# Patient Record
Sex: Male | Born: 1935 | Race: Black or African American | Hispanic: No | Marital: Married | Smoking: Former smoker
Health system: Southern US, Community
[De-identification: ages and names within clinical notes are randomized; demographics above are authoritative.]

## PROBLEM LIST (undated history)

## (undated) DIAGNOSIS — I779 Disorder of arteries and arterioles, unspecified: Secondary | ICD-10-CM

## (undated) DIAGNOSIS — J9811 Atelectasis: Secondary | ICD-10-CM

## (undated) DIAGNOSIS — M1990 Unspecified osteoarthritis, unspecified site: Secondary | ICD-10-CM

## (undated) DIAGNOSIS — I2510 Atherosclerotic heart disease of native coronary artery without angina pectoris: Secondary | ICD-10-CM

## (undated) DIAGNOSIS — R1310 Dysphagia, unspecified: Secondary | ICD-10-CM

## (undated) DIAGNOSIS — R269 Unspecified abnormalities of gait and mobility: Secondary | ICD-10-CM

## (undated) DIAGNOSIS — I739 Peripheral vascular disease, unspecified: Secondary | ICD-10-CM

## (undated) DIAGNOSIS — I4892 Unspecified atrial flutter: Secondary | ICD-10-CM

## (undated) DIAGNOSIS — R0600 Dyspnea, unspecified: Secondary | ICD-10-CM

## (undated) DIAGNOSIS — I509 Heart failure, unspecified: Secondary | ICD-10-CM

## (undated) DIAGNOSIS — I249 Acute ischemic heart disease, unspecified: Secondary | ICD-10-CM

## (undated) DIAGNOSIS — J189 Pneumonia, unspecified organism: Secondary | ICD-10-CM

## (undated) DIAGNOSIS — I480 Paroxysmal atrial fibrillation: Secondary | ICD-10-CM

## (undated) DIAGNOSIS — E785 Hyperlipidemia, unspecified: Secondary | ICD-10-CM

## (undated) DIAGNOSIS — I10 Essential (primary) hypertension: Secondary | ICD-10-CM

## (undated) DIAGNOSIS — K5900 Constipation, unspecified: Secondary | ICD-10-CM

## (undated) DIAGNOSIS — H5440 Blindness, one eye, unspecified eye: Secondary | ICD-10-CM

## (undated) DIAGNOSIS — F0390 Unspecified dementia without behavioral disturbance: Secondary | ICD-10-CM

## (undated) DIAGNOSIS — N400 Enlarged prostate without lower urinary tract symptoms: Secondary | ICD-10-CM

## (undated) DIAGNOSIS — F17201 Nicotine dependence, unspecified, in remission: Secondary | ICD-10-CM

## (undated) HISTORY — PX: SHOULDER HEMI-ARTHROPLASTY: SHX5049

## (undated) HISTORY — DX: Paroxysmal atrial fibrillation: I48.0

## (undated) HISTORY — DX: Nicotine dependence, unspecified, in remission: F17.201

## (undated) HISTORY — DX: Unspecified osteoarthritis, unspecified site: M19.90

## (undated) HISTORY — PX: CATARACT EXTRACTION W/ INTRAOCULAR LENS IMPLANT: SHX1309

## (undated) HISTORY — DX: Benign prostatic hyperplasia without lower urinary tract symptoms: N40.0

## (undated) HISTORY — DX: Hyperlipidemia, unspecified: E78.5

---

## 2001-03-29 HISTORY — PX: CORONARY ARTERY BYPASS GRAFT: SHX141

## 2001-04-17 ENCOUNTER — Encounter: Payer: Self-pay | Admitting: Cardiology

## 2001-04-17 ENCOUNTER — Ambulatory Visit (HOSPITAL_COMMUNITY): Admission: RE | Admit: 2001-04-17 | Discharge: 2001-04-17 | Payer: Self-pay | Admitting: Cardiology

## 2001-04-19 ENCOUNTER — Inpatient Hospital Stay (HOSPITAL_COMMUNITY): Admission: RE | Admit: 2001-04-19 | Discharge: 2001-04-28 | Payer: Self-pay | Admitting: Cardiology

## 2001-04-20 ENCOUNTER — Encounter: Payer: Self-pay | Admitting: Thoracic Surgery (Cardiothoracic Vascular Surgery)

## 2001-04-21 ENCOUNTER — Encounter: Payer: Self-pay | Admitting: Thoracic Surgery (Cardiothoracic Vascular Surgery)

## 2001-04-22 ENCOUNTER — Encounter: Payer: Self-pay | Admitting: Thoracic Surgery (Cardiothoracic Vascular Surgery)

## 2001-04-23 ENCOUNTER — Encounter: Payer: Self-pay | Admitting: Surgery

## 2001-04-24 ENCOUNTER — Encounter: Payer: Self-pay | Admitting: Thoracic Surgery (Cardiothoracic Vascular Surgery)

## 2001-04-24 ENCOUNTER — Encounter: Payer: Self-pay | Admitting: Surgery

## 2001-04-25 ENCOUNTER — Encounter: Payer: Self-pay | Admitting: Thoracic Surgery (Cardiothoracic Vascular Surgery)

## 2001-04-26 ENCOUNTER — Encounter: Payer: Self-pay | Admitting: Thoracic Surgery (Cardiothoracic Vascular Surgery)

## 2001-04-27 ENCOUNTER — Encounter: Payer: Self-pay | Admitting: Thoracic Surgery (Cardiothoracic Vascular Surgery)

## 2001-04-28 ENCOUNTER — Encounter: Payer: Self-pay | Admitting: Thoracic Surgery (Cardiothoracic Vascular Surgery)

## 2001-05-10 ENCOUNTER — Encounter: Payer: Self-pay | Admitting: Cardiology

## 2001-05-10 ENCOUNTER — Ambulatory Visit (HOSPITAL_COMMUNITY): Admission: RE | Admit: 2001-05-10 | Discharge: 2001-05-10 | Payer: Self-pay | Admitting: Cardiology

## 2001-05-22 ENCOUNTER — Encounter
Admission: RE | Admit: 2001-05-22 | Discharge: 2001-05-22 | Payer: Self-pay | Admitting: Thoracic Surgery (Cardiothoracic Vascular Surgery)

## 2001-05-22 ENCOUNTER — Encounter: Payer: Self-pay | Admitting: Thoracic Surgery (Cardiothoracic Vascular Surgery)

## 2001-05-23 ENCOUNTER — Encounter (HOSPITAL_COMMUNITY): Admission: RE | Admit: 2001-05-23 | Discharge: 2001-06-22 | Payer: Self-pay | Admitting: Cardiology

## 2001-06-21 ENCOUNTER — Encounter (HOSPITAL_COMMUNITY): Admission: RE | Admit: 2001-06-21 | Discharge: 2001-07-21 | Payer: Self-pay | Admitting: Cardiology

## 2001-07-24 ENCOUNTER — Encounter (HOSPITAL_COMMUNITY): Admission: RE | Admit: 2001-07-24 | Discharge: 2001-08-23 | Payer: Self-pay | Admitting: Cardiology

## 2001-08-10 ENCOUNTER — Ambulatory Visit (HOSPITAL_COMMUNITY): Admission: RE | Admit: 2001-08-10 | Discharge: 2001-08-10 | Payer: Self-pay | Admitting: Cardiology

## 2001-08-10 ENCOUNTER — Encounter: Payer: Self-pay | Admitting: Cardiology

## 2001-08-25 ENCOUNTER — Encounter (HOSPITAL_COMMUNITY): Admission: RE | Admit: 2001-08-25 | Discharge: 2001-09-24 | Payer: Self-pay | Admitting: Cardiology

## 2001-10-05 ENCOUNTER — Ambulatory Visit (HOSPITAL_COMMUNITY): Admission: RE | Admit: 2001-10-05 | Discharge: 2001-10-05 | Payer: Self-pay | Admitting: Cardiology

## 2001-10-05 ENCOUNTER — Encounter: Payer: Self-pay | Admitting: Cardiology

## 2002-04-19 ENCOUNTER — Encounter (HOSPITAL_COMMUNITY): Admission: RE | Admit: 2002-04-19 | Discharge: 2002-05-19 | Payer: Self-pay | Admitting: Cardiology

## 2004-05-04 ENCOUNTER — Ambulatory Visit: Payer: Self-pay | Admitting: Cardiology

## 2004-05-19 ENCOUNTER — Ambulatory Visit: Payer: Self-pay | Admitting: Cardiology

## 2004-05-19 ENCOUNTER — Inpatient Hospital Stay (HOSPITAL_COMMUNITY): Admission: EM | Admit: 2004-05-19 | Discharge: 2004-05-20 | Payer: Self-pay | Admitting: Emergency Medicine

## 2004-05-20 ENCOUNTER — Encounter: Payer: Self-pay | Admitting: Cardiology

## 2004-05-25 ENCOUNTER — Ambulatory Visit: Payer: Self-pay | Admitting: *Deleted

## 2004-05-25 ENCOUNTER — Ambulatory Visit (HOSPITAL_COMMUNITY): Admission: RE | Admit: 2004-05-25 | Discharge: 2004-05-25 | Payer: Self-pay | Admitting: Cardiology

## 2004-06-02 ENCOUNTER — Ambulatory Visit: Payer: Self-pay | Admitting: Cardiology

## 2004-07-02 ENCOUNTER — Ambulatory Visit: Payer: Self-pay | Admitting: Cardiology

## 2004-09-24 ENCOUNTER — Ambulatory Visit: Payer: Self-pay | Admitting: Orthopedic Surgery

## 2004-10-29 ENCOUNTER — Ambulatory Visit: Payer: Self-pay | Admitting: Orthopedic Surgery

## 2004-11-10 ENCOUNTER — Ambulatory Visit: Payer: Self-pay | Admitting: Orthopedic Surgery

## 2004-11-10 ENCOUNTER — Inpatient Hospital Stay (HOSPITAL_COMMUNITY): Admission: RE | Admit: 2004-11-10 | Discharge: 2004-11-11 | Payer: Self-pay | Admitting: Orthopedic Surgery

## 2004-11-10 ENCOUNTER — Encounter: Payer: Self-pay | Admitting: Orthopedic Surgery

## 2004-11-16 ENCOUNTER — Encounter (HOSPITAL_COMMUNITY): Admission: RE | Admit: 2004-11-16 | Discharge: 2004-12-25 | Payer: Self-pay | Admitting: Orthopedic Surgery

## 2004-11-18 ENCOUNTER — Ambulatory Visit: Payer: Self-pay | Admitting: Orthopedic Surgery

## 2004-12-16 ENCOUNTER — Ambulatory Visit: Payer: Self-pay | Admitting: Orthopedic Surgery

## 2004-12-28 ENCOUNTER — Encounter (HOSPITAL_COMMUNITY): Admission: RE | Admit: 2004-12-28 | Discharge: 2005-01-27 | Payer: Self-pay | Admitting: Orthopedic Surgery

## 2005-01-27 DIAGNOSIS — I679 Cerebrovascular disease, unspecified: Secondary | ICD-10-CM | POA: Insufficient documentation

## 2005-01-29 ENCOUNTER — Encounter (HOSPITAL_COMMUNITY): Admission: RE | Admit: 2005-01-29 | Discharge: 2005-02-28 | Payer: Self-pay | Admitting: Orthopedic Surgery

## 2005-02-05 ENCOUNTER — Ambulatory Visit: Payer: Self-pay | Admitting: Cardiology

## 2005-02-09 ENCOUNTER — Ambulatory Visit (HOSPITAL_COMMUNITY): Admission: RE | Admit: 2005-02-09 | Discharge: 2005-02-09 | Payer: Self-pay | Admitting: Cardiology

## 2005-02-15 ENCOUNTER — Ambulatory Visit: Payer: Self-pay | Admitting: Orthopedic Surgery

## 2005-03-01 ENCOUNTER — Encounter (HOSPITAL_COMMUNITY): Admission: RE | Admit: 2005-03-01 | Discharge: 2005-03-12 | Payer: Self-pay | Admitting: Orthopedic Surgery

## 2005-04-01 ENCOUNTER — Ambulatory Visit: Payer: Self-pay | Admitting: Cardiology

## 2005-05-13 ENCOUNTER — Ambulatory Visit: Payer: Self-pay | Admitting: Orthopedic Surgery

## 2005-06-23 ENCOUNTER — Ambulatory Visit (HOSPITAL_COMMUNITY): Admission: RE | Admit: 2005-06-23 | Discharge: 2005-06-23 | Payer: Self-pay | Admitting: Family Medicine

## 2005-08-19 ENCOUNTER — Ambulatory Visit: Payer: Self-pay | Admitting: *Deleted

## 2005-11-08 ENCOUNTER — Ambulatory Visit: Payer: Self-pay | Admitting: Orthopedic Surgery

## 2006-02-18 ENCOUNTER — Ambulatory Visit: Payer: Self-pay | Admitting: Cardiology

## 2006-02-26 IMAGING — US US CAROTID DUPLEX BILAT
1 series · 14 of 24 positions shown · non-contrast
Comparison: 04/17/01 by report.

CLINICAL DATA: Left carotid bruit.
BILATERAL CAROTID DUPLEX ULTRASOUND:

[Series 1: unknown · 0.07mm/px · 14 of 56 slices shown]
[im 1/56]
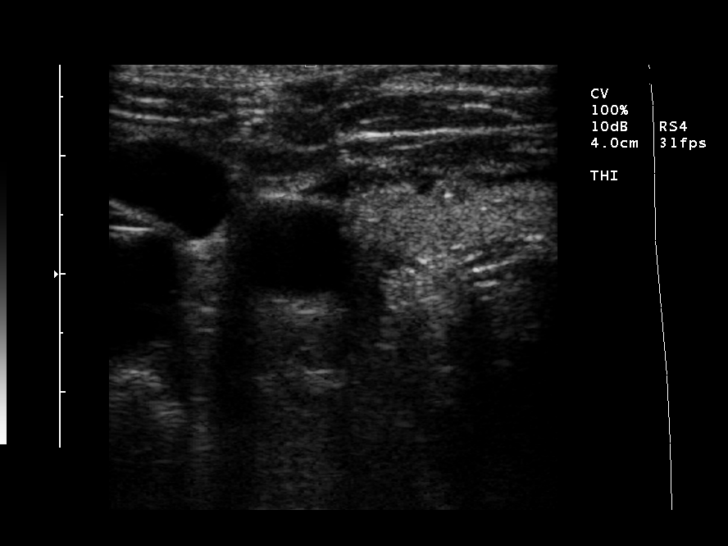
[im 5/56]
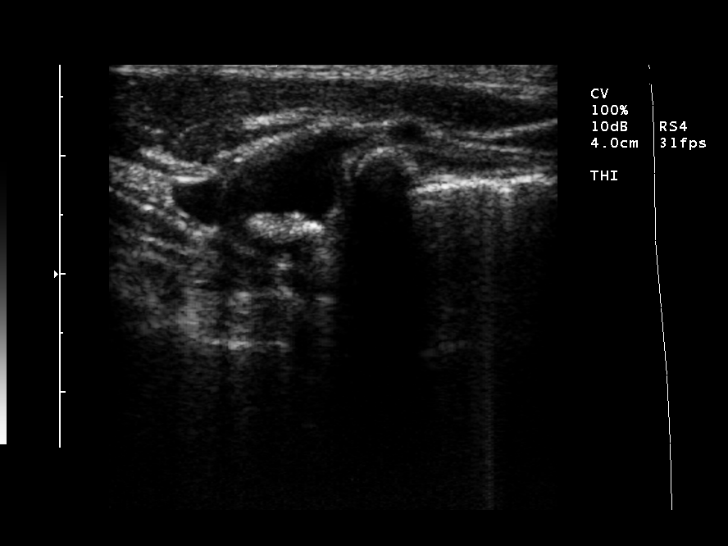
[im 10/56]
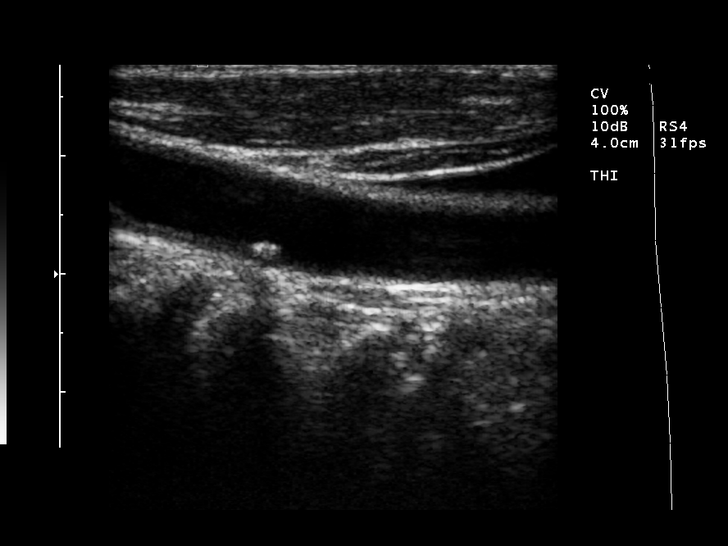
[im 15/56]
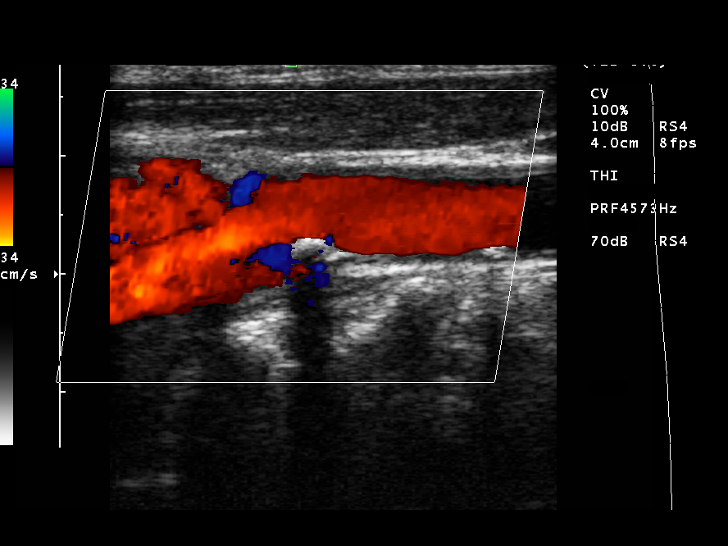
[im 17/56]
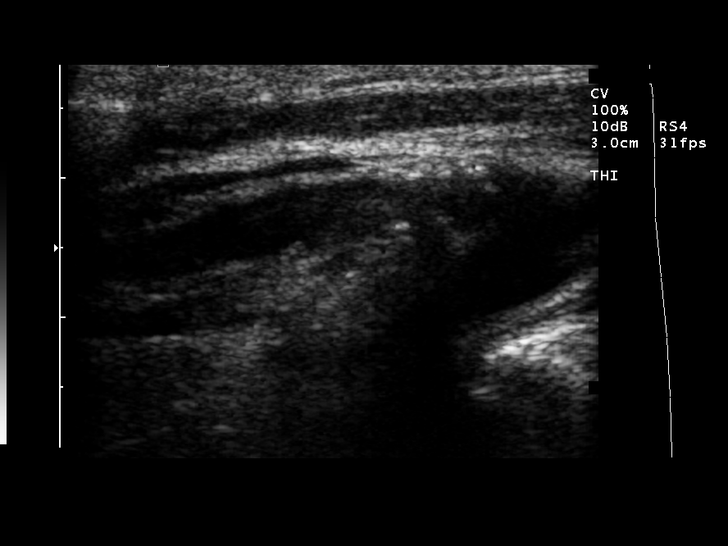
[im 22/56]
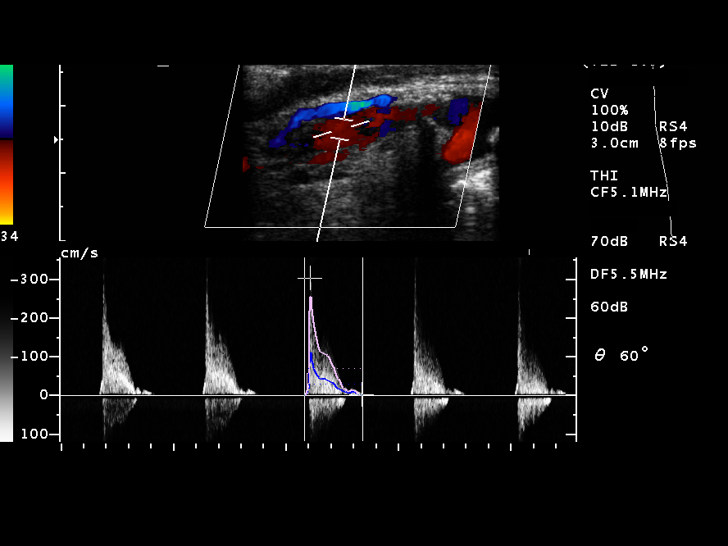
[im 27/56]
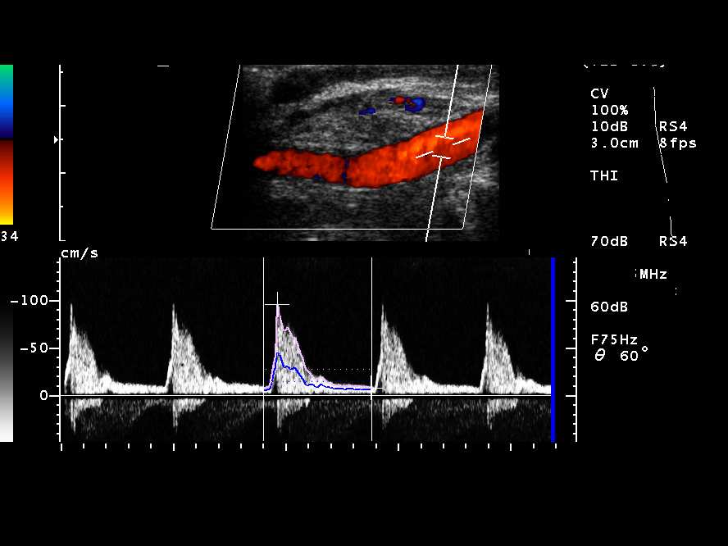
[im 29/56]
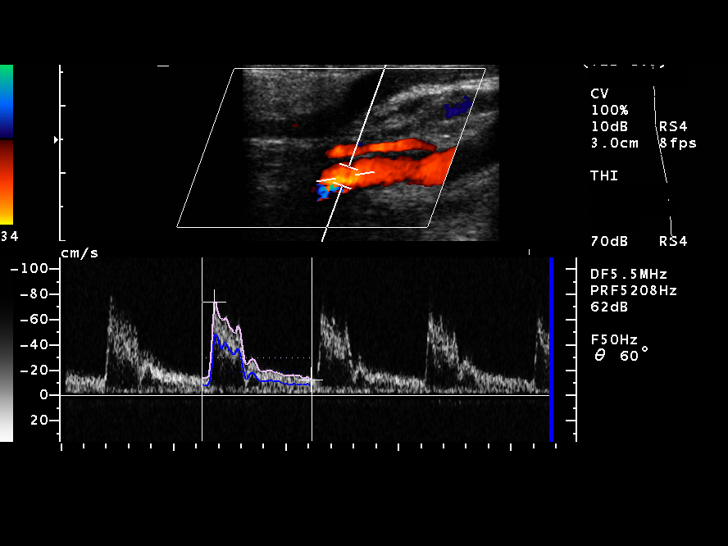
[im 34/56]
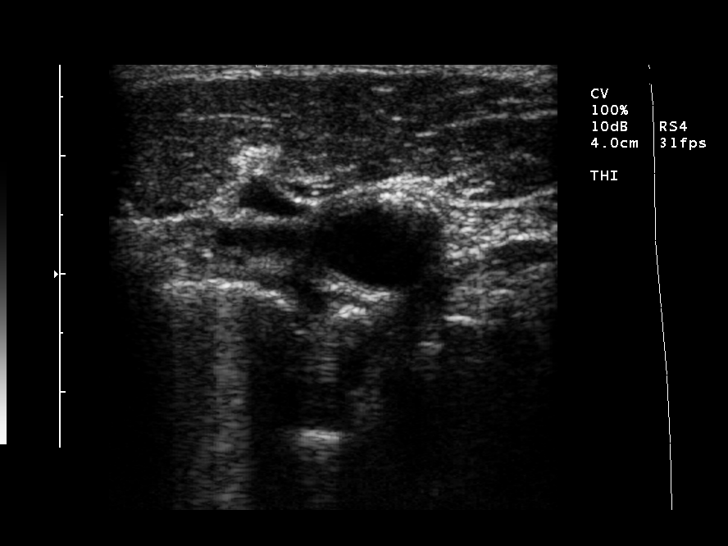
[im 39/56]
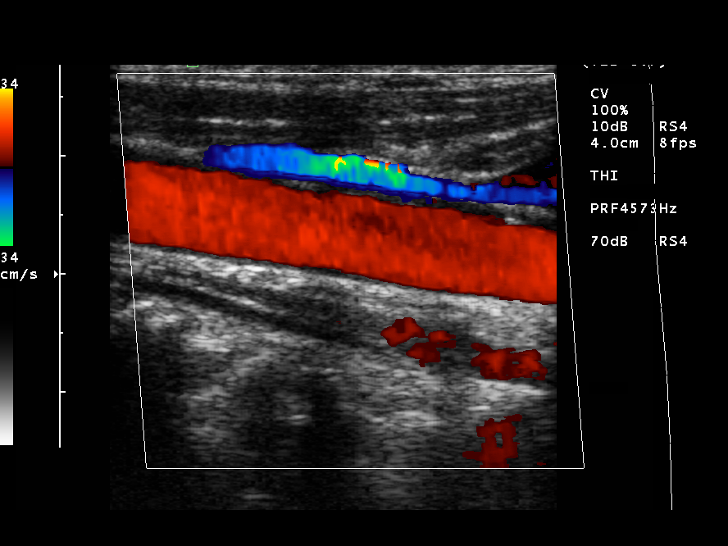
[im 44/56]
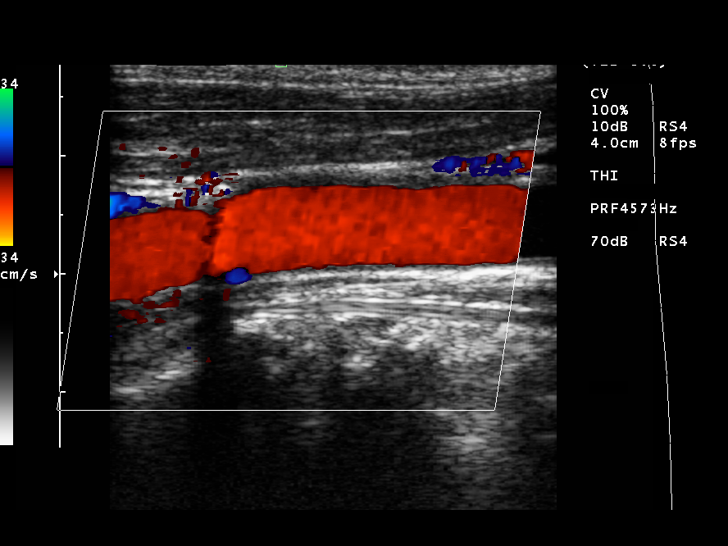
[im 46/56]
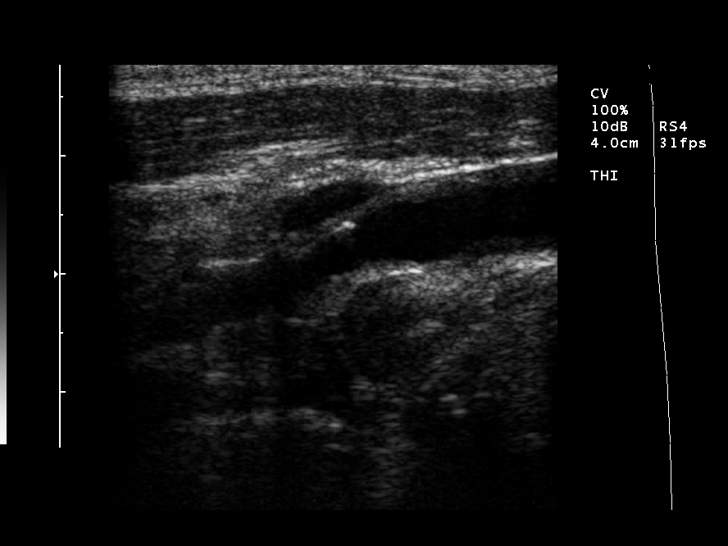
[im 51/56]
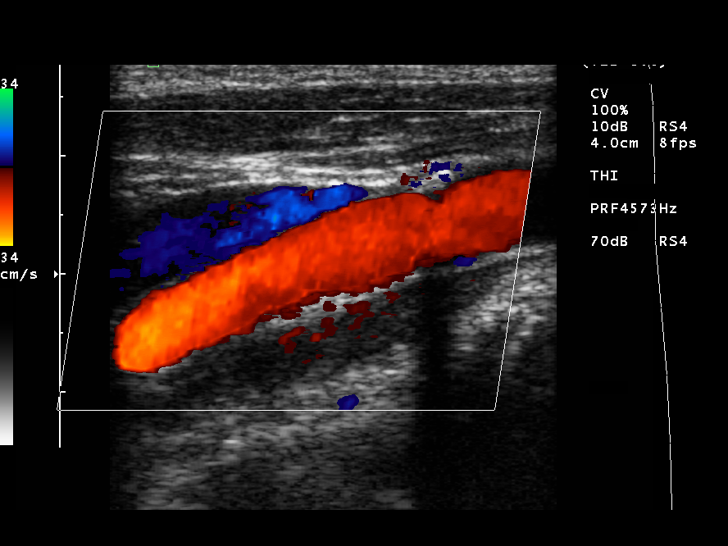
[im 56/56]
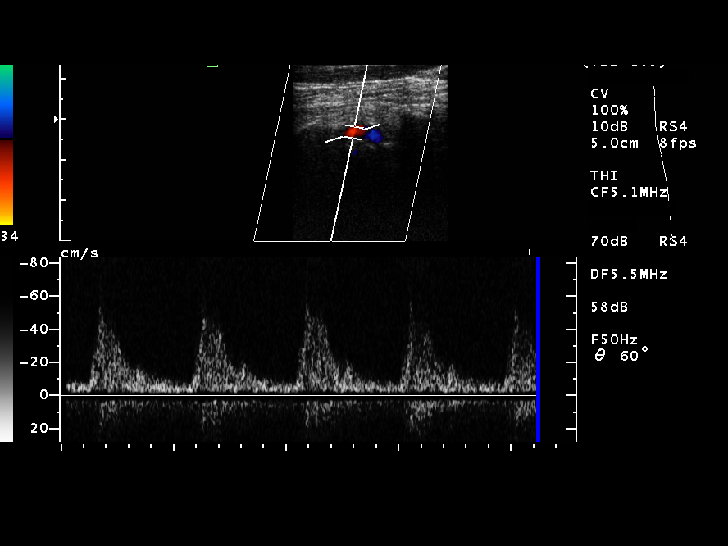

[14 of 24 positions shown; findings below may reference images not displayed]

FINDINGS: The exam is technically adequate.  There is partially calcified plaque in the distal right common carotid artery and bulb extending to the origins of the ICA and ECA.  There are elevated peak systolic velocities in the right external carotid artery origin.  Focal aliasing is seen here on color Doppler interrogation.  On the left, there is mild plaque in the carotid bulb extending into the proximal ICA.  Normal waveforms without elevated velocities or  aliasing on color Doppler interrogation.  Antegrade flow noted in both vertebral arteries. 
Velocities are as follows (cm/per second):  
  Right  Left
ICA
ECA
CCA
ICA/CCA RATIO
IMPRESSION: 1.  Bilateral carotid bifurcation and proximal ICA plaque without hemodynamically significant stenosis.  Continued surveillance recommended. 
2.  Origin plaque involving the external carotid arteries bilaterally, right greater than left, which may account for bruit on exam.

## 2006-09-04 ENCOUNTER — Emergency Department (HOSPITAL_COMMUNITY): Admission: EM | Admit: 2006-09-04 | Discharge: 2006-09-04 | Payer: Self-pay | Admitting: Emergency Medicine

## 2006-09-06 ENCOUNTER — Ambulatory Visit: Payer: Self-pay | Admitting: Cardiology

## 2006-09-07 ENCOUNTER — Ambulatory Visit: Payer: Self-pay | Admitting: Cardiology

## 2006-09-08 ENCOUNTER — Ambulatory Visit: Payer: Self-pay | Admitting: Cardiology

## 2006-09-09 ENCOUNTER — Ambulatory Visit (HOSPITAL_COMMUNITY): Admission: RE | Admit: 2006-09-09 | Discharge: 2006-09-09 | Payer: Self-pay | Admitting: Cardiology

## 2006-10-06 ENCOUNTER — Ambulatory Visit: Payer: Self-pay | Admitting: Cardiology

## 2006-11-09 ENCOUNTER — Ambulatory Visit: Payer: Self-pay | Admitting: Orthopedic Surgery

## 2007-04-19 ENCOUNTER — Ambulatory Visit: Payer: Self-pay | Admitting: Cardiology

## 2007-10-24 ENCOUNTER — Ambulatory Visit: Payer: Self-pay | Admitting: Cardiology

## 2007-12-07 ENCOUNTER — Ambulatory Visit: Payer: Self-pay | Admitting: Orthopedic Surgery

## 2008-04-18 ENCOUNTER — Ambulatory Visit: Payer: Self-pay | Admitting: Cardiology

## 2008-05-08 ENCOUNTER — Ambulatory Visit: Payer: Self-pay | Admitting: Ophthalmology

## 2008-05-20 ENCOUNTER — Ambulatory Visit: Payer: Self-pay | Admitting: Ophthalmology

## 2008-06-17 ENCOUNTER — Ambulatory Visit: Payer: Self-pay | Admitting: Cardiology

## 2008-11-13 ENCOUNTER — Encounter (INDEPENDENT_AMBULATORY_CARE_PROVIDER_SITE_OTHER): Payer: Self-pay | Admitting: *Deleted

## 2008-11-13 LAB — CONVERTED CEMR LAB
ALT: 21 units/L
AST: 27 units/L
Albumin: 4 g/dL
Alkaline Phosphatase: 82 units/L
BUN: 30 mg/dL
CO2: 27 meq/L
Calcium: 9.5 mg/dL
Chloride: 104 meq/L
Creatinine, Ser: 1.43 mg/dL
Glucose, Bld: 84 mg/dL
Potassium: 4.2 meq/L
Sodium: 140 meq/L
Total Protein: 7 g/dL

## 2008-11-27 ENCOUNTER — Ambulatory Visit (HOSPITAL_COMMUNITY): Admission: RE | Admit: 2008-11-27 | Discharge: 2008-11-27 | Payer: Self-pay | Admitting: Family Medicine

## 2008-12-30 ENCOUNTER — Ambulatory Visit: Payer: Self-pay | Admitting: Orthopedic Surgery

## 2009-04-09 ENCOUNTER — Encounter (INDEPENDENT_AMBULATORY_CARE_PROVIDER_SITE_OTHER): Payer: Self-pay | Admitting: *Deleted

## 2009-04-11 ENCOUNTER — Encounter (INDEPENDENT_AMBULATORY_CARE_PROVIDER_SITE_OTHER): Payer: Self-pay | Admitting: *Deleted

## 2009-04-11 ENCOUNTER — Ambulatory Visit: Payer: Self-pay | Admitting: Cardiology

## 2009-04-11 DIAGNOSIS — Z87898 Personal history of other specified conditions: Secondary | ICD-10-CM

## 2009-04-14 ENCOUNTER — Encounter: Payer: Self-pay | Admitting: Cardiology

## 2009-04-14 LAB — CONVERTED CEMR LAB
Cholesterol: 135 mg/dL (ref 0–200)
HDL: 41 mg/dL (ref >39)
LDL Cholesterol: 81 mg/dL (ref 0–99)
Total CHOL/HDL Ratio: 3.3
Triglycerides: 63 mg/dL (ref <150)
VLDL: 13 mg/dL (ref 0–40)

## 2009-04-16 ENCOUNTER — Encounter (INDEPENDENT_AMBULATORY_CARE_PROVIDER_SITE_OTHER): Payer: Self-pay | Admitting: *Deleted

## 2009-04-18 ENCOUNTER — Ambulatory Visit (HOSPITAL_COMMUNITY): Admission: RE | Admit: 2009-04-18 | Discharge: 2009-04-18 | Payer: Self-pay | Admitting: Cardiology

## 2009-12-26 ENCOUNTER — Ambulatory Visit: Payer: Self-pay | Admitting: Cardiology

## 2009-12-26 ENCOUNTER — Encounter: Payer: Self-pay | Admitting: Adult Health

## 2010-01-02 ENCOUNTER — Ambulatory Visit: Payer: Self-pay | Admitting: Cardiology

## 2010-01-05 LAB — CONVERTED CEMR LAB
BUN: 25 mg/dL — ABNORMAL HIGH (ref 6–23)
CO2: 30 meq/L (ref 19–32)
Calcium: 9.3 mg/dL (ref 8.4–10.5)
Chloride: 103 meq/L (ref 96–112)
Creatinine, Ser: 1.45 mg/dL (ref 0.40–1.50)
Glucose, Bld: 117 mg/dL — ABNORMAL HIGH (ref 70–99)
Potassium: 4 meq/L (ref 3.5–5.3)
Sodium: 142 meq/L (ref 135–145)

## 2010-01-06 ENCOUNTER — Encounter: Payer: Self-pay | Admitting: Cardiology

## 2010-02-04 ENCOUNTER — Ambulatory Visit: Payer: Self-pay | Admitting: Orthopedic Surgery

## 2010-04-28 NOTE — Assessment & Plan Note (Signed)
Summary: PT HAVING PROBLEMS W/SOB/TG  Medications Added TERAZOSIN HCL 2 MG CAPS (TERAZOSIN HCL) take 1 tab daily LISINOPRIL-HYDROCHLOROTHIAZIDE 20-12.5 MG TABS (LISINOPRIL-HYDROCHLOROTHIAZIDE) take 2 tablets by mouth once daily      Allergies Added: NKDA  Visit Type:  Follow-up Primary [name]:  Dr. Sherwood Gambler   History of Present Illness: [name] is a very pleasant 77 y/o male with known history of CAD, s/p CABG, carotid artery disease, HTN and arthritis.  He presents today for annual follow-up (actually 3 months early) without complaints.  He continues to be active and work in his yard, walk, and is asymptomatic.with exception of occasional pounding heart with heavy exertion.  He says when he rests the heart rate goes back to normal.  There is no pain associated, or increased shortness of breath.  He is in good spirits.  Current Medications (verified): 1)  Aspir-Low 81 Mg Tbec (Aspirin) .... Take 1 Tab Daily 2)  Nabumetone 500 Mg Tabs (Nabumetone) .... Take As Needed 3)  Terazosin Hcl 2 Mg Caps (Terazosin Hcl) .... Take 1 Tab Daily 4)  Lisinopril-Hydrochlorothiazide 20-12.5 Mg Tabs (Lisinopril-Hydrochlorothiazide) .... Take 2 Tablets By Mouth Once Daily 5)  Simvastatin 20 Mg Tabs (Simvastatin) .... Take 1 Tab Daily 6)  Lopressor 50 Mg Tabs (Metoprolol Tartrate) .... Take 1 Tab Two Times A Day  Allergies (verified): No Known Drug Allergies  Past History:  Past medical, surgical, family and social histories (including risk factors) reviewed, and no changes noted (except as noted below).  Past Medical History: Reviewed history from 04/11/2009 and no changes required. ASCVD: The coronary artery bypass graft surgery in 03/2001; stress nuclear in 2004-inferior infarction; normal      ejection fraction ParoxysmalATRIAL FIBRILLATION (ICD-427.31)-postoperative Left carotid BRUIT (ICD-785.9)-plaque without focal stenosis in 01/2005 HYPERLIPIDEMIA-MIXED (ICD-272.4) HYPERTENSION, UNSPECIFIED  (ICD-401.9) Tobacco abuse-discontinued in 1990 DEGENERATIVE JOINT DISEASE (ICD-715.90)-shoulder and knees Benign prostatic hypertrophy  Past Surgical History: Reviewed history from 04/11/2009 and no changes required. Coronary artery bypass graft surgery-2003 Right shoulder hemiarthroplasty- Dr. Romeo Apple Right cataract extraction and lens implant  Family History: Reviewed history from 04/11/2009 and no changes required. Father suffered sudden death at age 46  Social History: Reviewed history from 04/11/2009 and no changes required. Retired from employment with [employer] Married with 7 children, one deceased Tobacco Use - Former.  Alcohol Use - no  Review of Systems       All other systems have been reviewed and are negative unless stated above.   Vital Signs:  Patient profile:   77 year old male Weight:      168 pounds BMI:     26.41 O2 Sat:      96 % on Room air Pulse rate:   65 / minute BP sitting:   155 / 60  (right arm)  Vitals Entered By: Dreama Saa, CNA (December 26, 2009 1:17 PM)  O2 Flow:  Room air  Physical Exam  General:  Well developed, well nourished, in no acute distress. Head:  normocephalic and atraumatic Eyes:  PERRLA/EOM intact; conjunctiva and lids normal. Lungs:  Clear bilaterally to auscultation and percussion. Heart:  L carotid bruit, none on the right.  RRR without MRG Abdomen:  Bowel sounds positive; abdomen soft and non-tender without masses, organomegaly, or hernias noted. No hepatosplenomegaly. Msk:  Back normal, normal gait. Muscle strength and tone normal. Pulses:  pulses normal in all 4 extremities Extremities:  No clubbing or cyanosis. Neurologic:  Alert and oriented x 3. Psych:  Normal affect.   Impression &  Recommendations:  Problem # 1:  ATHEROSCLEROTIC CARDIOVASCULAR DISEASE (ICD-429.2) He is asymptomatic and continues active.  He is to continue current medications.    Problem # 2:  CAROTID BRUIT-LEFT  (ICD-785.9) Review of carotid studies completed 03/2009 demonstrated extensive plaque formation bilaterally at the common carotid arteries, carotid bulbs and extending into the proximal internal and external carotid arteries.  Probable stenosis of bilateral proximal external carotid arteries.  By velosity measurements, plaque at the proximal L ICA corresponds to a 50%-69% luminal diameter stenosis.  This has been discussed with Dr. Dietrich Pates who recommends repeat doppler study in 6 months.    [name] is asymptomatic and has no complaints of headache, dizziness, or confusion.  He will continue his aspirin as directed. Orders: Carotid Duplex (Carotid Duplex)  Problem # 3:  HYPERTENSION, UNSPECIFIED (ICD-401.9) Blood pressure results are not optimal. Review of past BP revealed 140's systolic.  Will increase Lisniopril to 20/12.5mg  2 tablets daily.  Will repeat BMET in 2 weeks and have him follow-up for nurse visit in one week for blood pressure check. His updated medication list for this problem includes:    Aspir-low 81 Mg Tbec (Aspirin) .Marland Kitchen... Take 1 tab daily    Terazosin Hcl 2 Mg Caps (Terazosin hcl) .Marland Kitchen... Take 1 tab daily    Lisinopril-hydrochlorothiazide 20-12.5 Mg Tabs (Lisinopril-hydrochlorothiazide) .Marland Kitchen... Take 2 tablets by mouth once daily    Lopressor 50 Mg Tabs (Metoprolol tartrate) .Marland Kitchen... Take 1 tab two times a day  Future Orders: T-Basic Metabolic Panel [phone]) ... 01/06/2010  Patient Instructions: 1)  Your physician recommends that you schedule a follow-up appointment in: 1 week for Blood pressure check with nurse and in 6 months with Dr. Dietrich Pates 2)  Your physician recommends that you return for lab work in: 2 weeks 3)  Your physician has recommended you make the following change in your medication: Increase Lisinopril to 20/12.5mg - take 2 tablets by mouth once daily  4)  Your physician has requested that you have a carotid duplex. This test is an ultrasound of the carotid  arteries in your neck. It looks at blood flow through these arteries that supply the brain with blood. Allow one hour for this exam. There are no restrictions or special instructions. 5)  Your physician has requested that you regularly monitor and record your blood pressure readings at home.  Please use the same machine at the same time of day to check your readings and record them to bring to your follow-up visit. Prescriptions: LISINOPRIL-HYDROCHLOROTHIAZIDE 20-12.5 MG TABS (LISINOPRIL-HYDROCHLOROTHIAZIDE) take 2 tablets by mouth once daily  #60 x 6   Entered by:   Larita Fife Via LPN   Authorized by:   Joni Reining, NP   Signed by:   Larita Fife Via LPN on 78/46/9629   Method used:   Electronically to        [facility] [address] [phone]* (retail)       [address] [place]  [address]       Ph: [phone]       Fax: [phone]   RxID:   [id number]

## 2010-04-28 NOTE — Assessment & Plan Note (Signed)
Summary: yrly xr rt shoulder/sec hor/bsf   Visit Type:  Follow-up Primary Provider:  Dr. Sherwood Gambler  CC:  recheck shoulder replacement.  History of Present Illness: I saw Graylin Wimberley in the office today for a followup visit.  He is a 77 years old man with the complaint of:  right shoulder.  DOS 11-10-04.  year 5   Procedure: RIGHT SHOULDER HEMI-ARTHROPLASTY.  Doing well.  MEDS: EMR.  Today, scheduled for:  YEARLY XRAY.  No pain in his shoulder at this time.     Allergies: No Known Drug Allergies   Impression & Recommendations: AP and lateral RIGHT shoulder.  Large prosthetic replacement hemiarthroplasty, RIGHT shoulder with CTA head in stable configuration. No loosening.  Impression stable RIGHT shoulder prosthesis  Other Orders: Est. Patient Level II (45409) Shoulder x-ray,  minimum 2 views (81191)  Patient Instructions: 1)  Please schedule a follow-up appointment as needed.   Orders Added: 1)  Est. Patient Level II [47829] 2)  Shoulder x-ray,  minimum 2 views [73030]

## 2010-04-28 NOTE — Letter (Signed)
Summary: [place] [facility] Work [facility], agricultural at [facility]  [address] [place] [address]   Phone: [phone]  Fax: [phone]     April 11, 2009 MRN: [id number]   AVIS MCMAHILL [address] [place], [place]  [address]      YOUR LAB WORK IS DUE  MONDAY  Please go to [facility], located across the street from [facility] on the second floor.  Hours are Monday - Friday 7am until 7:30pm         Saturday 8am until 12noon    _X_  DO NOT EAT OR DRINK AFTER MIDNIGHT EVENING PRIOR TO LABWORK  __ YOUR LABWORK IS NOT FASTING --YOU MAY EAT PRIOR TO LABWORK

## 2010-04-28 NOTE — Letter (Signed)
Summary: BP LOG  BP LOG   Imported By: Faythe Ghee 01/06/2010 10:26:30  _____________________________________________________________________  External Attachment:    Type:   Image     Comment:   External Document

## 2010-04-28 NOTE — Assessment & Plan Note (Signed)
Summary: 1 YR FU/SN  Medications Added ASPIR-LOW 81 MG TBEC (ASPIRIN) take 1 tab daily NABUMETONE 500 MG TABS (NABUMETONE) take as needed TERAZOSIN HCL 5 MG CAPS (TERAZOSIN HCL) Take 1 tablet by mouth once a day LISINOPRIL-HYDROCHLOROTHIAZIDE 20-12.5 MG TABS (LISINOPRIL-HYDROCHLOROTHIAZIDE) take 1 tab daily SIMVASTATIN 20 MG TABS (SIMVASTATIN) take 1 tab daily LOPRESSOR 50 MG TABS (METOPROLOL TARTRATE) take 1 tab two times a day      Allergies Added: NKDA  Visit Type:  Follow-up Primary Provider:  Dr. Sherwood Gambler   History of Present Illness: [name] reports a generally good year from a medical standpoint since his last office visit 12 months ago.  He has had no significant medical illnesses, has required no urgent medical care nor has he been seen in the emergency department or hospital. He is principally limited by arthritic discomfort involving his neck and lower extremities, but remains fairly active.  He has occasional burning chest discomfort that resolves spontaneously.  He has no exertional symptoms.  Current Medications (verified): 1)  Aspir-Low 81 Mg Tbec (Aspirin) .... Take 1 Tab Daily 2)  Nabumetone 500 Mg Tabs (Nabumetone) .... Take As Needed 3)  Terazosin Hcl 5 Mg Caps (Terazosin Hcl) .... Take 1 Tablet By Mouth Once A Day 4)  Lisinopril-Hydrochlorothiazide 20-12.5 Mg Tabs (Lisinopril-Hydrochlorothiazide) .... Take 1 Tab Daily 5)  Simvastatin 20 Mg Tabs (Simvastatin) .... Take 1 Tab Daily 6)  Lopressor 50 Mg Tabs (Metoprolol Tartrate) .... Take 1 Tab Two Times A Day  Allergies (verified): No Known Drug Allergies  Past History:  PMH, FH, and Social History reviewed and updated.  Review of Systems  The patient denies anorexia, fever, weight loss, weight gain, vision loss, decreased hearing, hoarseness, chest pain, syncope, dyspnea on exertion, peripheral edema, prolonged cough, headaches, and abdominal pain.    Vital Signs:  Patient profile:   77 year old  male Height:      67 inches Weight:      167 pounds Pulse rate:   70 / minute BP sitting:   148 / 64  (right arm)  Vitals Entered By: Dreama Saa, CNA (April 11, 2009 2:47 PM)  Physical Exam  General:   General-Well developed; no acute distress:   Neck-No JVD; no carotid bruits: Lungs-No tachypnea, no rales; no rhonchi; no wheezes: Cardiovascular-normal PMI; normal S1 and S2: Abdomen-BS normal; soft and non-tender without masses or organomegaly:  Musculoskeletal-No deformities, no cyanosis or clubbing: Neurologic-Normal cranial nerves; symmetric strength and tone:  Skin-Warm, no significant lesions: Extremities-Nl distal pulses; no edema:     Impression & Recommendations:  Problem # 1:  ATHEROSCLEROTIC CARDIOVASCULAR DISEASE (ICD-429.2) No symptoms to suggest myocardial ischemia.  Management will continue to focus on optimal control of risk factors.  Problem # 2:  CAROTID BRUIT-LEFT (ICD-785.9) Cerebral circulation was last assessed 5 years ago.  A repeat carotid ultrasound will be obtained.  He remains asymptomatic from a neurologic standpoint.  Problem # 3:  HYPERTENSION, UNSPECIFIED (ICD-401.9) Blood pressure control is relatively good, but not ideal.  [name] reports that his dose of Hytrin was reduced at an office visit with Dr. Sherwood Gambler, but he is not clear why.  Since then, he has had increasing problems with urinary symptoms.  Attempts to reach Dr. Sherwood Gambler were unsuccessful.  I've asked [name] to increase Hytrin to 5 mg q.d. pending a discussion with his primary care team.  Problem # 4:  HYPERLIPIDEMIA Fasting lipid profile will be obtained and medication adjusted appropriately.  I will  plan to reassess this nice gentleman in one year.  Other Orders: Carotid Duplex (Carotid Duplex) Future Orders: T-Lipid Profile ([id number]) ... 04/14/2009  Patient Instructions: 1)  Your physician recommends that you return for a FASTING lipid profile:  next week 2)  Your  physician has recommended you make the following change in your medication:  increase terazosin to 5mg  daily 3)  Your physician has requested that you have a carotid duplex. This test is an ultrasound of the carotid arteries in your neck. It looks at blood flow through these arteries that supply the brain with blood. Allow one hour for this exam. There are no restrictions or special instructions. 4)  Your physician recommends that you schedule a follow-up appointment in: 1 year Prescriptions: TERAZOSIN HCL 5 MG CAPS (TERAZOSIN HCL) Take 1 tablet by mouth once a day  #30 x 6   Entered by:   Teressa Lower RN   Authorized by:   Kathlen Brunswick, MD, [facility]   Signed by:   Teressa Lower RN on 04/11/2009   Method used:   Electronically to        [facility]. [phone]* (retail)       [address], [place]  [address]       Ph: [phone]       Fax: [phone]   RxID:   [id number]

## 2010-04-28 NOTE — Assessment & Plan Note (Signed)
Summary: 1 wk bp check per checkout on 9/30/tg  Nurse Visit   Vital Signs:  Patient profile:   77 year old male Weight:      166 pounds O2 Sat:      92 % on Room air Pulse rate:   59 / minute BP sitting:   152 / 67  (left arm)  Vitals Entered ByLarita Fife Via LPN (January 02, 2010 8:56 AM)  O2 Flow:  Room air  Current Medications (verified): 1)  Aspir-Low 81 Mg Tbec (Aspirin) .... Take 1 Tab Daily 2)  Nabumetone 500 Mg Tabs (Nabumetone) .... Take 1 Tablet By Mouth Two Times A Day 3)  Terazosin Hcl 2 Mg Caps (Terazosin Hcl) .... Take 1 Tab Daily 4)  Lisinopril-Hydrochlorothiazide 20-12.5 Mg Tabs (Lisinopril-Hydrochlorothiazide) .... Take 2 Tablets By Mouth Once Daily 5)  Simvastatin 20 Mg Tabs (Simvastatin) .... Take 1 Tab Daily 6)  Lopressor 50 Mg Tabs (Metoprolol Tartrate) .... Take 1 Tab Two Times A Day  Allergies (verified): No Known Drug Allergies  Primary Provider:  Dr. Sherwood Gambler   History of Present Illness: S: Pt. arrives in office for 1 week BP check. B: On last OV with Joni Reining, NP Lisinopril was increased to 20/12.5mg  2 tablets by mouth once daily for HTN. A: Pt. has no complaints at this time. BP this morning=152/67. He brought in medications and BP diary. He is taking meds as directed. Mr. Meunier states that he is not sure how to read his BP cuff and is not sure readings are correct. Diary scanned into chart. R: Pt. asked to bring BP cuff to office when he can so we can check cuff and teach pt. how to read.    ************ No changes at this time.  Thanks!  Joni Reining NP  Pt. advised.     Larita Fife Via LPN  January 05, 2010 1:49 PM

## 2010-04-28 NOTE — Letter (Signed)
Summary: [place] Results Engineer, agricultural at [facility]  [address], [place] [address]   Phone: [phone]  Fax: [phone]      April 16, 2009 MRN: [id number]   [name] [address] [place], [place]  [address]   Dear Mr. PASION,  Your test ordered by Selena Batten has been reviewed by your physician (or physician assistant) and was found to be normal or stable. Your physician (or physician assistant) felt no changes were needed at this time.  ____ Echocardiogram  ____ Cardiac Stress Test  __x__ Lab Work  ____ Peripheral vascular study of arms, legs or neck  ____ CT scan or X-ray  ____ Lung or Breathing test  ____ Other:  No change in medical treatment at this time, per Dr. Dietrich Pates.  Thank you, Kida Digiulio Allyne Gee RN    Creston Bing, MD, Lenise Arena.C.Gaylord Shih, MD, F.A.C.C Lewayne Bunting, MD, F.A.C.C Nona Dell, MD, F.A.C.C Charlton Haws, MD, Lenise Arena.C.C

## 2010-04-28 NOTE — Miscellaneous (Signed)
Summary: LABS CMP,11/13/2008  Clinical Lists Changes  Observations: Added new observation of CALCIUM: 9.5 mg/dL (16/12/9602 54:09) Added new observation of ALBUMIN: 4.0 g/dL (81/19/1478 29:56) Added new observation of PROTEIN, TOT: 7.0 g/dL (21/30/8657 84:69) Added new observation of SGPT (ALT): 21 units/L (11/13/2008 16:47) Added new observation of SGOT (AST): 27 units/L (11/13/2008 16:47) Added new observation of ALK PHOS: 82 units/L (11/13/2008 16:47) Added new observation of CREATININE: 1.43 mg/dL (62/95/2841 32:44) Added new observation of BUN: 30 mg/dL (03/31/7251 66:44) Added new observation of BG RANDOM: 84 mg/dL (03/47/4259 56:38) Added new observation of CO2 PLSM/SER: 27 meq/L (11/13/2008 16:47) Added new observation of CL SERUM: 104 meq/L (11/13/2008 16:47) Added new observation of K SERUM: 4.2 meq/L (11/13/2008 16:47) Added new observation of NA: 140 meq/L (11/13/2008 16:47)

## 2010-05-21 ENCOUNTER — Other Ambulatory Visit: Payer: Self-pay | Admitting: Cardiology

## 2010-05-21 DIAGNOSIS — R0989 Other specified symptoms and signs involving the circulatory and respiratory systems: Secondary | ICD-10-CM

## 2010-05-25 ENCOUNTER — Ambulatory Visit (HOSPITAL_COMMUNITY): Admission: RE | Admit: 2010-05-25 | Payer: Medicare Other | Source: Ambulatory Visit

## 2010-05-28 ENCOUNTER — Other Ambulatory Visit (HOSPITAL_COMMUNITY): Payer: Self-pay | Admitting: Family Medicine

## 2010-05-28 ENCOUNTER — Ambulatory Visit (HOSPITAL_COMMUNITY)
Admission: RE | Admit: 2010-05-28 | Discharge: 2010-05-28 | Disposition: A | Discharge status: Home / Self Care | Payer: Medicare Other | Source: Ambulatory Visit | Attending: Family Medicine | Admitting: Family Medicine

## 2010-05-28 DIAGNOSIS — R000 Tachycardia, unspecified: Secondary | ICD-10-CM

## 2010-05-28 DIAGNOSIS — R609 Edema, unspecified: Secondary | ICD-10-CM

## 2010-05-28 DIAGNOSIS — I509 Heart failure, unspecified: Secondary | ICD-10-CM

## 2010-05-28 DIAGNOSIS — R05 Cough: Secondary | ICD-10-CM | POA: Insufficient documentation

## 2010-05-28 DIAGNOSIS — R0609 Other forms of dyspnea: Secondary | ICD-10-CM | POA: Insufficient documentation

## 2010-05-28 DIAGNOSIS — Z951 Presence of aortocoronary bypass graft: Secondary | ICD-10-CM | POA: Insufficient documentation

## 2010-05-28 DIAGNOSIS — R059 Cough, unspecified: Secondary | ICD-10-CM | POA: Insufficient documentation

## 2010-05-28 DIAGNOSIS — R0989 Other specified symptoms and signs involving the circulatory and respiratory systems: Secondary | ICD-10-CM | POA: Insufficient documentation

## 2010-05-30 ENCOUNTER — Emergency Department (HOSPITAL_COMMUNITY)
Admission: EM | Admit: 2010-05-30 | Discharge: 2010-05-30 | Disposition: A | Discharge status: Home / Self Care | Payer: Medicare Other | Attending: Emergency Medicine | Admitting: Emergency Medicine

## 2010-05-30 ENCOUNTER — Encounter (HOSPITAL_COMMUNITY): Payer: Self-pay | Admitting: Radiology

## 2010-05-30 ENCOUNTER — Emergency Department (HOSPITAL_COMMUNITY): Payer: Medicare Other

## 2010-05-30 DIAGNOSIS — I2510 Atherosclerotic heart disease of native coronary artery without angina pectoris: Secondary | ICD-10-CM | POA: Insufficient documentation

## 2010-05-30 DIAGNOSIS — R002 Palpitations: Secondary | ICD-10-CM | POA: Insufficient documentation

## 2010-05-30 DIAGNOSIS — I10 Essential (primary) hypertension: Secondary | ICD-10-CM | POA: Insufficient documentation

## 2010-05-30 DIAGNOSIS — E7800 Pure hypercholesterolemia, unspecified: Secondary | ICD-10-CM | POA: Insufficient documentation

## 2010-05-30 LAB — BASIC METABOLIC PANEL
Chloride: 106 mEq/L (ref 96–112)
Creatinine, Ser: 1.24 mg/dL (ref 0.4–1.5)
GFR calc Af Amer: >60 mL/min (ref >60)
GFR calc non Af Amer: 56 mL/min — ABNORMAL LOW (ref >60)
Glucose, Bld: 85 mg/dL (ref 70–99)
Potassium: 3.9 mEq/L (ref 3.5–5.1)
Sodium: 140 mEq/L (ref 135–145)

## 2010-05-30 MED ORDER — IOHEXOL 350 MG/ML SOLN
100.0000 mL | Freq: Once | INTRAVENOUS | Status: AC | PRN
  Administered 2010-05-30: 100 mL via INTRAVENOUS

## 2010-05-31 DIAGNOSIS — R0989 Other specified symptoms and signs involving the circulatory and respiratory systems: Secondary | ICD-10-CM

## 2010-06-02 ENCOUNTER — Encounter (INDEPENDENT_AMBULATORY_CARE_PROVIDER_SITE_OTHER): Payer: Self-pay | Admitting: *Deleted

## 2010-06-02 ENCOUNTER — Ambulatory Visit (HOSPITAL_COMMUNITY)
Admission: RE | Admit: 2010-06-02 | Discharge: 2010-06-02 | Disposition: A | Discharge status: Home / Self Care | Payer: Medicare Other | Source: Ambulatory Visit | Attending: Cardiology | Admitting: Cardiology

## 2010-06-02 ENCOUNTER — Ambulatory Visit: Payer: Self-pay | Admitting: Adult Health

## 2010-06-02 DIAGNOSIS — F17200 Nicotine dependence, unspecified, uncomplicated: Secondary | ICD-10-CM | POA: Insufficient documentation

## 2010-06-02 DIAGNOSIS — R0989 Other specified symptoms and signs involving the circulatory and respiratory systems: Secondary | ICD-10-CM | POA: Insufficient documentation

## 2010-06-02 DIAGNOSIS — I10 Essential (primary) hypertension: Secondary | ICD-10-CM | POA: Insufficient documentation

## 2010-06-02 DIAGNOSIS — I658 Occlusion and stenosis of other precerebral arteries: Secondary | ICD-10-CM | POA: Insufficient documentation

## 2010-06-02 DIAGNOSIS — I6529 Occlusion and stenosis of unspecified carotid artery: Secondary | ICD-10-CM | POA: Insufficient documentation

## 2010-06-04 ENCOUNTER — Encounter (INDEPENDENT_AMBULATORY_CARE_PROVIDER_SITE_OTHER): Payer: Self-pay | Admitting: *Deleted

## 2010-06-08 ENCOUNTER — Telehealth: Payer: Self-pay

## 2010-06-08 ENCOUNTER — Encounter: Payer: Self-pay | Admitting: Adult Health

## 2010-06-08 ENCOUNTER — Ambulatory Visit (INDEPENDENT_AMBULATORY_CARE_PROVIDER_SITE_OTHER): Payer: Medicare Other | Admitting: Adult Health

## 2010-06-08 VITALS — BP 174/85 | HR 134 | Resp 22 | Ht 67.0 in | Wt 160.0 lb

## 2010-06-08 DIAGNOSIS — Z91199 Patient's noncompliance with other medical treatment and regimen due to unspecified reason: Secondary | ICD-10-CM

## 2010-06-08 DIAGNOSIS — I2510 Atherosclerotic heart disease of native coronary artery without angina pectoris: Secondary | ICD-10-CM

## 2010-06-08 DIAGNOSIS — I10 Essential (primary) hypertension: Secondary | ICD-10-CM

## 2010-06-08 DIAGNOSIS — R0989 Other specified symptoms and signs involving the circulatory and respiratory systems: Secondary | ICD-10-CM

## 2010-06-08 DIAGNOSIS — Z9119 Patient's noncompliance with other medical treatment and regimen: Secondary | ICD-10-CM

## 2010-06-08 DIAGNOSIS — R000 Tachycardia, unspecified: Secondary | ICD-10-CM

## 2010-06-08 NOTE — Progress Notes (Signed)
Subjective:      Patient ID: [name] is a 77 y.o. male.  Chief Complaint: HPIMr. Whitehead is here for continuing assessment and treatment of CAD, s/p CABG, carotid artery disease, hypertension, with history of arthritis.  He comes today at the request of [name] for palpatations.  He is mildly confused concerning his medications and does not remember if he took them today.  He states he can tell when his heart rate is up. He apparently did some yard work this weekend and noticed his HR up then.  As a result of this, he is here for further evaluation.  Review of Systems  Constitution: Negative.  HENT: Negative.   Eyes: Negative.   Cardiovascular: Positive for palpitations.  Respiratory: Negative.   Musculoskeletal: Negative.       Objective:    Physical Exam  Constitutional: He appears healthy.  Eyes: Pupils are equal, round, and reactive to light.  Neck: Normal range of motion.  Cardiovascular: Regular rhythm and normal heart sounds.  Tachycardia present.   Pulses:      Carotid pulses are 1+ on the right side, and 1+ on the left side with bruit.      Radial pulses are 1+ on the right side, and 1+ on the left side.       Femoral pulses are 1+ on the right side, and 1+ on the left side.      Popliteal pulses are 1+ on the right side, and 1+ on the left side.       Dorsalis pedis pulses are 1+ on the right side, and 1+ on the left side.       Posterior tibial pulses are 1+ on the right side, and 1+ on the left side.       Tachycardic   Pulmonary/Chest: Breath sounds normal.  Abdominal: Soft.  Musculoskeletal: Normal range of motion.  Neurological: He is alert. He exhibits a cognitive deficit.  Skin: Skin is warm.    Lab Review:      Assessment:   Tachycardia:  [name] is not taking his medications correctly.  He is to be on BID doses of the lopressor 50mg  BID.  He is to be on BID doses of the lisinopril.  He is sometimes uncertain if he has taken his medications.    Hypetension:   Not well controlled at present.   Plan:    I have brought his wife into the room to discuss the need to be accurate and to take medications as directed.  She says that he takes them on his own and she trusts him to do so.  I have talked with them both.  His wife will assist him to take his medications as directed. We have printed out his medication list and times to take them.  He is advised to get a pill box that is divided into days for both nighttime and daytime dosing.  I have given him a dose of his lopressor 50 mg and lisinopril 20/12.5mg  here in the office.  He will come back in one week to have BP and heart rate checked.

## 2010-06-08 NOTE — Patient Instructions (Signed)
Take medications as directed. Review list of medication dosing time Come back in one week for BP and HR check

## 2010-06-09 NOTE — Letter (Signed)
Summary: [place] Results Engineer, agricultural at [facility]  [address], [place] [address]   Phone: [phone]  Fax: [phone]      June 04, 2010 MRN: [id number]   TERIN CRAGLE [address] [place], [place]  [address]   Dear Mr. FABELA,  Your test ordered by Selena Batten has been reviewed by your physician (or physician assistant) and was found to be normal or stable. Your physician (or physician assistant) felt no changes were needed at this time.  ____ Echocardiogram  _x___ Cardiac Stress Test  ____ Lab Work  ____ Peripheral vascular study of arms, legs or neck  ____ CT scan or X-ray  ____ Lung or Breathing test  ____ Other:  No change in medical treatment at this time, per Zonia Kief.  Please call our office to schedule an appointment, that you missed last month.  Thank you, Caylin Raby Allyne Gee RN    [place] Bing, MD, Lenise Arena.C.Gaylord Shih, MD, F.A.C.C Lewayne Bunting, MD, F.A.C.C Nona Dell, MD, F.A.C.C Charlton Haws, MD, Lenise Arena.C.C

## 2010-06-09 NOTE — Letter (Signed)
Summary: Appointment - Missed   [facility]  [address] [place] [address]   Phone: [phone]  Fax: [phone]     June 02, 2010 MRN: [id number]   [name] [address] [place], [place]  [address]   Dear [name],  Our records indicate you missed your appointment on       06/02/10                 with Dr.       .           MCDOWELL                            It is very important that we reach you to reschedule this appointment. We look forward to participating in your health care needs. Please contact us at the number listed above at your earliest convenience to reschedule this appointment.     Sincerely,    Glass blower/designer

## 2010-06-15 ENCOUNTER — Encounter (INDEPENDENT_AMBULATORY_CARE_PROVIDER_SITE_OTHER): Payer: Medicare Other

## 2010-06-15 ENCOUNTER — Encounter: Payer: Self-pay | Admitting: Adult Health

## 2010-06-15 DIAGNOSIS — I10 Essential (primary) hypertension: Secondary | ICD-10-CM

## 2010-06-16 NOTE — Assessment & Plan Note (Signed)
Summary: *** DR MCGOUGH WANTED PT SEEN FOR PALP AND CP/TMJ REC ON FILE...  Medications Added LISINOPRIL-HYDROCHLOROTHIAZIDE 20-12.5 MG TABS (LISINOPRIL-HYDROCHLOROTHIAZIDE) take 1tablets by mouth once daily LOPRESSOR 50 MG TABS (METOPROLOL TARTRATE) take 1 tab two times a day      Allergies Added: NKDA  Visit Type:  Follow-up Primary Provider:  Dr. Sherwood Gambler  CC:  palpitations.  History of Present Illness: [name] is a pleasant 77 y/o AAM we are following for know history of  CAD, s/p CABG, carotid artery disease, hypertension and arthritis.  He comes today at the request of Dr. Regino Schultze for palpatations.  He is mildy confused concerning his medications.  He states he can tell that his HR is up.  He apparently did some yard work this weekend and noticed that his heart rate has gotten fast.   He denies chest pain, shortness of breath or dizziness.    Current Medications (verified): 1)  Aspir-Low 81 Mg Tbec (Aspirin) .... Take 1 Tab Daily 2)  Nabumetone 500 Mg Tabs (Nabumetone) .... Take 1 Tablet By Mouth Two Times A Day 3)  Terazosin Hcl 2 Mg Caps (Terazosin Hcl) .... Take 1 Tab Daily 4)  Lisinopril-Hydrochlorothiazide 20-12.5 Mg Tabs (Lisinopril-Hydrochlorothiazide) .... Take 1tablets By Mouth Once Daily 5)  Simvastatin 20 Mg Tabs (Simvastatin) .... Take 1 Tab Daily 6)  Lopressor 50 Mg Tabs (Metoprolol Tartrate) .... Take 1 Tab Two Times A Day  Allergies (verified): No Known Drug Allergies  Comments:  Nurse/Medical Assistant: patient brought meds states he takes them just like the bottle states he uses [facility] in [place]  Review of Systems       palpatations All other systems have been reviewed and are negative unless stated above.   Vital Signs:  Patient profile:   77 year old male Weight:      160 pounds BMI:     25.15 Pulse rate:   134 / minute BP sitting:   174 / 85  (left arm)  Vitals Entered By: Dreama Saa, CNA (June 08, 2010 11:00 AM)  Physical  Exam  General:  Well developed, well nourished, in no acute distress. Lungs:  Clear bilaterally to auscultation and percussion. Heart:  Tachycardic with regular rhythm.  Distant heart sounds.  No rubs or gallops.  Pulses are palpable. Carotid bruit is on the left (old). Abdomen:  Bowel sounds positive; abdomen soft and non-tender without masses, organomegaly, or hernias noted. No hepatosplenomegaly. Msk:  Back normal, normal gait. Muscle strength and tone normal. Pulses:  pulses normal in all 4 extremities Extremities:  No clubbing or cyanosis. Neurologic:  Alert and oriented x 3. Psych:  He has some mild memory difficulties.   EKG  Procedure date:  06/08/2010  Findings:      Sinus tachycardia with rate of:  140bpm  Impression & Recommendations:  Problem # 1:  ATHEROSCLEROTIC CARDIOVASCULAR DISEASE (ICD-429.2) He is asymptomatic from CV standpoint with the exception of palpatations. He is not taking his lopressor as directed. He takes it at night only, at 50mg , when it should be a two times a day dose.  He is also not taking lisinoprial two times a day as directed.  He is sometimes uncertain if he takes his medications. I have brought his wife into the clinic room to ask if he had taken his medications. She says that he takes them on his own and she trusts him to do so.  I have talked with them both.  She is will assist him to  remember his medications. It has also been suggested that he get a pill divider to assist with dosing and remembering his medications.  His wife will assist him with this. I have given him his am dose of lopressor 50mg , and lisinopril 20/12.5 today in the office.  We will have him come back for a BP check and HR check in a few days to reevalutate.  Problem # 2:  HYPERTENSION, UNSPECIFIED (ICD-401.9) As above.  Not well controlled.   His updated medication list for this problem includes:    Aspir-low 81 Mg Tbec (Aspirin) .Marland Kitchen... Take 1 tab daily    Terazosin Hcl 2 Mg  Caps (Terazosin hcl) .Marland Kitchen... Take 1 tab daily    Lisinopril-hydrochlorothiazide 20-12.5 Mg Tabs (Lisinopril-hydrochlorothiazide) .Marland Kitchen... Take 1tablets by mouth once daily    Lopressor 50 Mg Tabs (Metoprolol tartrate) .Marland Kitchen... Take 1 tab two times a day  Problem # 3:  CAROTID BRUIT-LEFT (ICD-785.9) He had a follow-up carotid study on June 02, 2010.  There was plaque formation bilaterally throughtout the carotid systems most prominent at the carotid bulbs.  elevated peralk systolic velocities within the external carotid arteries bilaterally suggesting stenosis..  When compared to the previous exam, obtained peak systolic velocity in teh left ICA on current study is less than that seen on the previous exam.  Will continue to monitor this annually.  Patient Instructions: 1)  Your physician recommends that you schedule a follow-up appointment in: 6 months 2)  Your physician recommends that you continue on your current medications as directed. Please refer to the Current Medication list given to you today. 3)  ***You have been provided with a list of your medications and directions on how to take, please follow this list carefully. ***Also, yor physician recommends that you obtain a pill box to better help you take your medications correctly. If you have any questions please call this office at [phone]. Prescriptions: LOPRESSOR 50 MG TABS (METOPROLOL TARTRATE) take 1 tab two times a day  #180 x 1   Entered by:   Larita Fife Via LPN   Authorized by:   Joni Reining, NP   Signed by:   Larita Fife Via LPN on 82/95/6213   Method used:   Electronically to        [facility]. [address] [phone]* (retail)       [address] [place]  [address]       Ph: [phone]       Fax: [phone]   RxID:   [id number] LISINOPRIL-HYDROCHLOROTHIAZIDE 20-12.5 MG TABS (LISINOPRIL-HYDROCHLOROTHIAZIDE) take 1tablets by mouth once daily  #90 x 1   Entered by:   Larita Fife Via LPN   Authorized by:   Joni Reining, NP    Signed by:   Larita Fife Via LPN on 56/43/3295   Method used:   Electronically to        [facility]. [address] [phone]* (retail)       [address], [place]  [address]       Ph: [phone]       Fax: [phone]   RxID:   [id number]

## 2010-06-16 NOTE — Progress Notes (Signed)
**Note De-Identified Nathaneal Sommers Obfuscation** Summary: BP check scheduled   Phone Note Outgoing Call   Call placed by: Larita Fife Pam Vanalstine LPN,  June 08, 2010 2:27 PM Summary of Call: Per Joni Reining, NP, pt. is scheduled for a BP check/nurse visit on 06-15-10 @ 9:00am. Pt. is aware and wrote instructions down then repeated back to me.  Initial call taken by: Larita Fife Chyler Creely LPN,  June 08, 2010 2:27 PM

## 2010-06-18 ENCOUNTER — Ambulatory Visit: Payer: Self-pay | Admitting: Cardiology

## 2010-06-25 NOTE — Assessment & Plan Note (Signed)
Summary: 1 wk nurse visit for bp check/tg  Nurse Visit   Vital Signs:  Patient profile:   77 year old male Height:      67 inches Weight:      159 pounds O2 Sat:      98 % on Room air Pulse rate:   65 / minute BP sitting:   146 / 71  (left arm)  Vitals Entered ByLarita Fife Via LPN (June 15, 2010 9:53 AM)  O2 Flow:  Room air  Current Medications (verified): 1)  Aspir-Low 81 Mg Tbec (Aspirin) .... Take 1 Tab Daily 2)  Nabumetone 500 Mg Tabs (Nabumetone) .... Take 1 Tablet By Mouth Two Times A Day 3)  Terazosin Hcl 2 Mg Caps (Terazosin Hcl) .... Take 1 Tab Daily 4)  Lisinopril-Hydrochlorothiazide 20-12.5 Mg Tabs (Lisinopril-Hydrochlorothiazide) .... Take 1tablets By Mouth Once Daily 5)  Simvastatin 20 Mg Tabs (Simvastatin) .... Take 1 Tab Daily 6)  Lopressor 50 Mg Tabs (Metoprolol Tartrate) .... Take 1 Tab Two Times A Day  Allergies (verified): No Known Drug Allergies  Primary Provider:  Dr. Sherwood Gambler   History of Present Illness: S: Pt. arrives in office for a 1 week BP check/nurse visit. B: On last OV with Joni Reining, NP on 06-08-10, pt. was advised to take his medications as directed, to buy a pill box and divider and to come to this BP check. A: Pt. c/o sob on exertion, otherwise he has no complaints at this time. His BP this morning is 146/71 and on last OV BP was 174/85. He brought in his pill box and list of medications. It appears that pt. is taking as directed.  R: Pt. advised that we will contact him with Harriet Pho, NP's recommendations, if any.  No changes to medications at this time.  Joni Reining NP

## 2010-07-12 ENCOUNTER — Other Ambulatory Visit: Payer: Self-pay | Admitting: Adult Health

## 2010-08-11 NOTE — Letter (Signed)
October 06, 2006     RE:  DEVUN, ANNA  MRN:  [id number]  /  DOB:  1923-07-05   ADDENDUM:  Mr. Motta reports chronic constipation that has not responded  to simple over-the-counter laxatives.  He requests a prescription for  lactulose, which was provided at a dose of 15 mL per day.  He will  increase this to 15 mL b.i.d. if the initial dosage is ineffective.    Sincerely,      [name], MD, [facility]  Electronically Signed    RMR/MedQ  DD: 10/06/2006  DT: 10/07/2006  Job #: [id number]

## 2010-08-11 NOTE — Letter (Signed)
October 06, 2006    Madelin Rear. Sherwood Gambler, MD  [address]  [place], [place] [address]   RE:  REEDY, BIERNAT  MRN:  [id number]  /  DOB:  1923/05/12   Dear Peyton Najjar,   Mr. Durfee returns to the office for continued assessment and treatment of  paroxysmal atrial flutter.  Since his last visit, he has felt generally  well.  He notes occasional mild palpitations.  He has not had any  additional episodes of weakness.  He has carried an event recorder,  which documented recurrent atrial flutter, some episodes lasting a  number of hours.  Diltiazem 180 mg daily was added to his medical  regimen.  Subsequently, no arrhythmias were noted.   PHYSICAL EXAM:  A trim gentleman, in no acute distress.  The weight is  164, three pounds more than last month.  Blood pressure 120/45, heart  rate 62 and regular.  NECK:  No jugular venous distention; no carotid bruits.  LUNGS:  Minimal inspiratory rhonchi.  CARDIAC:  Normal first and second heart sounds; prominent fourth heart  sound.  ABDOMEN:  Soft and nontender; no organomegaly.  EXTREMITIES:  Trace edema.   IMPRESSION:  Mr. Fogg is doing generally well with his current  medication.  Although an argument could be made for anticoagulation,  there are concerns about his reliability for Warfarin management.  With  flutter that quantitatively occupied less than six hours over the course  of a month, I am not inclined to anticoagulate him at this time, nor to  consider radiofrequency ablation.  We will continue his current  medications and plan a return visit in six months.   ADDENDUM:  Mr. Asare reports chronic constipation that has not responded  to simple over-the-counter laxatives.  He requests a prescription for  lactulose, which was provided at a dose of 15 mL per day.  He will  increase this to 15 mL b.i.d. if the initial dosage is ineffective.    Sincerely,      [name], MD, [facility]  Electronically Signed    RMR/MedQ  DD: 10/06/2006  DT:  10/07/2006  Job #: [id number]

## 2010-08-11 NOTE — Letter (Signed)
April 18, 2008    Madelin Rear. Sherwood Gambler, MD  [address]  [place], [place] [address]   RE:  TEYON, ODETTE  MRN:  [id number]  /  DOB:  05-Jan-1924   Dear Peyton Najjar,   Mr. Knisely returns to the office for continued assessment and treatment of  coronary artery disease and cardiovascular risk factors, now 7 years  following CABG surgery.  He is having no chest discomfort.  He has  chronic and stable class II dyspnea on exertion.  He remains fairly  active without difficulty.  He does have some arthritic discomfort,  particularly in the left knee.  He was given medication for this some  months ago, which he takes occasionally, but whose name he cannot  recall.   OTHER MEDICATIONS:  1. Aspirin 81 mg daily.  2. Metoprolol 50 mg b.i.d.  3. Lisinopril HCT 20/12.5 mg daily.  4. Diltiazem 180 mg daily.  5. Terazosin 2 mg daily.  6. Simvastatin 20 mg daily.   A lipid profile was obtained a year ago and was excellent.   PHYSICAL EXAMINATION:  GENERAL:  Trimmed, pleasant gentleman in no acute  distress.  VITAL SIGNS:  The weight is 166, unchanged.  Blood pressure 155/60,  heart rate 55 and regular, respirations 12 and unlabored.  NECK:  No jugular venous distention; faint right carotid bruits.  LUNGS:  Clear.  CARDIAC:  Normal first and second heart sounds; modest systolic ejection  murmur.  ABDOMEN:  Soft and nontender; no organomegaly.  EXTREMITIES:  Trace edema; distal pulses intact.   IMPRESSION:  Mr. Lardizabal is doing well from a symptomatic standpoint.  Blood pressure control appears suboptimal at this visit.  Looking back,  he occasionally has elevated blood pressure, but generally pressures are  quite good.  Due to bradycardia and suboptimal control of hypertension,  we will go head and substitute amlodipine for diltiazem at a dose of 10  mg daily.  He will follow blood pressures at home and return to see the  Cardiology nurses in 2 months for reassessment.  I will adjust blood  pressure  medicines if necessary at that time and otherwise plan to see  this nice gentleman again in 1 year.    Sincerely,      [name], MD, [facility]  Electronically Signed    RMR/MedQ  DD: 04/18/2008  DT: 04/19/2008  Job #: [id number]

## 2010-08-11 NOTE — Letter (Signed)
April 19, 2007    Madelin Rear. Sherwood Gambler, MD  [address]  [place], [place] [address]   RE:  BECKHEM, ISADORE  MRN:  [id number]  /  DOB:  February 18, 1924   Dear Peyton Najjar:   Mr. Glomski returns to the office for continued assessment and treatment of  coronary disease, atrial arrhythmias, and cardiovascular risk factors.  Over the past year, he has done beautifully.  He reports only mild  transient orthostatic hypotension.  He leads a sedentary lifestyle  without dyspnea or fatigue.  He notes some palpitations when he exerts  himself.  He has had no clinical evidence for recurrent atrial  arrhythmias.  Blood pressure control has apparently been good.   Current medications include aspirin 81 mg daily, metoprolol 50 mg  b.i.d., lisinopril/hydrochlorothiazide 20/12.5 mg daily, Diltiazem 180  mg daily, terazosin 5 mg daily, simvastatin 20 mg daily.  His last lipid  profile was 18 months ago and was quite good.   On exam, thin, pleasant gentleman in no acute distress.  The weight is 166, 5 pounds more than in June 2008.  Blood pressure  135/70, heart rate 55 and regular, respirations 18.  NECK:  No jugular venous distention; normal carotid upstrokes with  minimal left bruit.  LUNGS:  Clear.  CARDIAC:  Normal first and second heart sounds; fourth heart sound  versus splitting of the first heart sound.  Normal PMI.  ABDOMEN:  Soft and nontender; no organomegaly.  EXTREMITIES:  No edema; distal pulses intact.   IMPRESSION:  Mr. Galentine is doing beautifully.  Hypertension is under good  control.  We will assess his lipid status and obtain a chemistry profile  to monitor current medical therapy.  His carotids were assessed in late  2006 - this need not be repeated for 2 or more years.  I will plan to  see this nice gentleman again in 1 year.    Sincerely,      [name], MD, [facility]  Electronically Signed    RMR/MedQ  DD: 04/19/2007  DT: 04/19/2007  Job #: [id number]

## 2010-08-11 NOTE — Letter (Signed)
September 06, 2006    Madelin Rear. Sherwood Gambler, MD  [address]  [place], [place]  [address]   RE:  JAVYON, FONTAN  MRN:  [id number]  /  DOB:  February 15, 1924   Dear Peyton Najjar,   Mr. Cooks is seen in the office today at his request. He was evaluated in  the emergency department 2 days ago for weakness. He had been working  outside in the hot sun for approximately 1 hour. He subsequently felt  poorly. It is not clear whether he recovered overnight, but following  day he felt constipated and took Epsom salt. He had profound reaction in  terms of a bowel movement and then once again felt very weak prompting  evaluation in the emergency department. He was found to have a heart  rate of 140. An EKG was obtained, but the tracing was misinterpreted as  sinus tachycardia. The actual rhythm was atrial flutter. There was  significant ST-T abnormalities, which were not reported. Hopefully,  these were due to his arrhythmia alone. He was treated with hydration,  but no follow up EKG or rhythm strip is available. He was said to have  been sent home with a heart rate of 130.   CURRENT MEDICATIONS:  Include;  1. Aspirin 81 mg daily.  2. Metoprolol 50 mg b.i.d.  3. Terazosin 5 mg at bedtime.  4. Simvastatin 20 mg daily.  5. Lisinopril/hydrochlorothiazide 20/12.5 mg daily.   On exam, pleasant, trim older gentleman in no acute distress. The weight  is 161, four pounds less than last November. Blood pressure 160/70,  heart rate 70 and regular, respirations 18.  NECK: No jugular venous distension; normal carotid upstrokes without  bruits.  LUNGS: Clear.  CARDIAC: Prominent splitting of the second heart sound; minimal early  systolic ejection murmur.  ABDOMEN: Soft and nontender. No organomegaly.  EXTREMITIES: No edema; 1 + distal pulses.  NEURO/MUSCULAR: Symmetric strength and tone; normal cranial nerves;  normal gait.   IMPRESSION:  Mr. Nehme suffered an episode of atrial flutter of unknown  duration. This certainly could have accounted for his symptoms a few  days ago. It is unclear why he continues to complain of weakness now. We  will proceed with event recording to determine how frequently atrial  flutter occurs and whether or not he can detect symptoms during  arrhythmia. I will see him again thereafter and decide whether addition  therapy is warranted including considerations of anticoagulation.    Sincerely,      [name], MD, [facility]  Electronically Signed    RMR/MedQ  DD: 09/06/2006  DT: 09/06/2006  Job #: [id number]   CC:    Devoria Albe, M.D.

## 2010-08-11 NOTE — Procedures (Signed)
[name], WETMORE NO.:  [id number]   MEDICAL RECORD NO.:  [id number]          PATIENT TYPE:  OUT   LOCATION:  RAD                           FACILITY:  [facility]   PHYSICIAN:  [name], MD, FACCDATE OF BIRTH:  03-May-1923   DATE OF PROCEDURE:  09/09/2006  DATE OF DISCHARGE:                                ECHOCARDIOGRAM   REFERRING PHYSICIANS:  Kirk Ruths, MD, and [name],  MD   CLINICAL DATA:  An 77 year old gentleman with coronary disease and  hypertension.   M-mode:  Aorta 3.2, left atrium 3.0, septum 1.6, posterior wall 1.1, LV  diastole 3.7, LV systole 2.5.   1. Technically suboptimal but adequate echocardiographic study.  2. Normal left atrium, right atrium and right ventricle.  3. Mild to moderate aortic valvular sclerosis.  4. Normal diameter of the proximal ascending aorta; mild calcification      of the wall.  5. Normal pulmonic valve and proximal pulmonary artery.  6. Delicate mitral valve with very mild calcification of the leaflets      and submitral structures.  Moderate annular calcification.  7. Normal tricuspid valve.  8. Normal internal dimension of the left ventricle; mild hypertrophy      with disproportionate thickening of the upper septum.  Not all      myocardial segments are adequately evaluated.  No segmental wall      motion abnormalities are identified.  Overall LV systolic function      is normal.  9. Normal IVC.      [name], MD, [facility]  Electronically Signed     RMR/MEDQ  D:  09/09/2006  T:  09/10/2006  Job:  [id number]

## 2010-08-14 NOTE — Letter (Signed)
February 18, 2006    Madelin Rear. Sherwood Gambler, MD  [address]  [place], [place] [address]   RE:  RAZA, BAYLESS  MRN:  [id number]  /  DOB:  12-01-1923   Dear Peyton Najjar,   Mr. Thon returns to the office for continued assessment and treatment of  coronary disease and cardiovascular risk factors, now five years following  CABG surgery.  He has done extremely well over the past year with no medical  issues, no ER visits, no surgery and no hospitalizations.  He reports no  dyspnea nor chest discomfort.  He has had no palpitations.  He checks blood  pressure every once in a while, which has been excellent.   MEDICATIONS:  Unchanged from last year.   PHYSICAL EXAMINATION:  GENERAL:  Pleasant, trim gentleman.  VITAL SIGNS:  The weight is 165, 6 pounds more than in November of last  year.  Blood pressure 120/50, heart rate 68 with occasional prematures.  Respirations 16.  NECK:  No jugular venous distention; faint left carotid bruit.  LUNGS:  Clear.  Straight back.  CARDIAC:  Normal first and second heart sounds; fourth heart sound present.  ABDOMEN:  Soft and nontender.  No bruits.  No organomegaly.  EXTREMITIES:  No edema.  Distal pulses intact.   IMPRESSION:  Mr. Abdallah is doing beautifully.  Vaccinations are up to date.  A  lipid profile was checked in May and was excellent.  Chemistry profile was  normal.  I will plan to see this nice gentleman again in one year.    Sincerely,      [name], MD, [facility]  Electronically Signed    RMR/MedQ  DD: 02/18/2006  DT: 02/18/2006  Job #: [id number]

## 2010-08-14 NOTE — H&P (Signed)
NAMEPAYAM, GRIBBLE NO.:  [id number]   MEDICAL RECORD NO.:  [id number]          PATIENT TYPE:  EMS   LOCATION:  MAJO                         FACILITY:  [facility]   PHYSICIAN:  Rollene Rotunda, M.D.   DATE OF BIRTH:  1923/04/15   DATE OF ADMISSION:  05/19/2004  DATE OF DISCHARGE:                                HISTORY & PHYSICAL   REFERRING PHYSICIAN:  Madelin Rear. Sherwood Gambler, MD   REASON FOR ADMISSION:  Mr. Mccleery is a 77 year old male, with known coronary  artery disease status post six-vessel coronary artery bypass surgery in  January 2003, followed by Dr. Sterling Bing in [place], who presents  directly to the emergency room from Dr. Sharyon Medicus office via EMS, for  evaluation of chest discomfort and abnormal electrocardiogram.   The patient denies any recent history of exertional chest discomfort or  dyspnea.  Of note, he walks on his treadmill at home for approximately one-  half hour and does so at least four to five times a week.  After his usual  treadmill session earlier this morning, the patient went outside and did  some work on his car.  After walking back inside, his wife notified him  about the sudden death of his second cousin last evening.  He then  experienced some mild anterior chest pressure which lasted approximately 15  minutes.  There was no associated dyspnea, diaphoresis, or nausea.  However,  he was concerned enough to present to Dr. Sherwood Gambler.  While there, and  electrocardiogram was done which was worrisome for development of loss of R  waves in the precordial leads.  Dr. Sherwood Gambler conferred with Dr. Vida Roller,  and recommendation was to transfer her to [facility] for possible  consideration of cardiac catheterization.   ALLERGIES:  No known drug allergies.   MEDICATIONS PRIOR TO ADMISSION:  1.  Aspirin 81 mg daily.  2.  Metoprolol 50 b.i.d.  3.  Altace 5 daily.  4.  Zocor 20 q.h.s.  5.  Hytrin 5 q.h.s.  6.  Naprosyn 500 daily.   PAST  MEDICAL HISTORY:  1.  Coronary artery disease.      1.  Six-vessel CABG January 2003:  LIMA-LAD, SVG-diagonal, SVG-OM-1 and          OM-2, SVG-RCA-PDA.      2.  Preserved left ventricular function with inferior hypokinesis.      3.  Postoperative paroxysmal atrial fibrillation, treated with          amiodarone and Coumadin.      4.  Non-ischemic exercise Cardiolite with fixed inferior defect; EF 71%          in January 2004.  2.  Hypertension.  3.  Dyslipidemia.  4.  History of 80% vertebral artery stenosis.  5.  Arthritis.  6.  Status post left lens implant.   SOCIAL HISTORY:  The patient lives near [place] with his wife.  They have  a total of seven children (one deceased).  He has not smoked for  approximately 15 years and denies alcohol use.  He is retired  from [employer].   FAMILY HISTORY:  Father deceased at the age of 37, sudden death attributed  to myocardial infarction.   REVIEW OF SYSTEMS:  Negative for exertional chest discomfort, dyspnea,  orthopnea, PND, or lower extremity edema.  Negative for palpitations.  Denies any recent evidence of upper or lower GI bleeding.  Denies heartburn  symptoms.  Review of Systems negative.   PHYSICAL EXAMINATION:  VITAL SIGNS:  Blood pressure 214/83 on admission,  pulse 62 and regular, respirations 12, temperature 97.1, O2 saturation 97%  on 2 liters.  GENERAL:  A 77 year old male in no apparent distress.  HEENT:  Normocephalic and atraumatic.  NECK:  Bilateral carotid pulses without bruits.  LUNGS: Diminished breath sounds on the right side without crackles or  wheezes.  HEART:  Regular rate and rhythm (S1, S2).  No murmurs, rubs, or gallops.  ABDOMEN:  Soft, nontender with intact bowel sounds and without bruits.  No  hepatomegaly.  EXTREMITIES:  Preserved bilateral femoral pulses without bruits; intact  distal pulses with no significant pedal edema.  NEUROLOGIC:  No focal deficit.   LABORATORY DATA AND OTHER STUDIES:   Chest x-ray:  Pending.   Electrocardiogram:  Sinus bradycardia at 57 beats per minute with normal  axis; question new anterior Q wave as compared to EKG 2003.   Laboratory data:  Pending.   IMPRESSION:  A 77 year old male with known coronary artery disease, status  post six-vessel coronary artery bypass grafting in January 2003, with a non-  ischemic stress Cardiolite in January 2004, now presents to the emergency  room for evaluation of chest discomfort in the setting of possible new  abnormal electrocardiogram.   PLAN:  1.  The patient's chest discomfort is atypical for ischemia.  However,      electrocardiogram does suggest loss of R wave and development of new Q      waves in leads V1 and V2.  Therefore, recommendation is to admit patient      to telemetry for rule out of myocardial infarction.  We will order a 2-D      echocardiogram for reassessment of LV function and wall motion      abnormality.  If there is evidence of a new wall motion abnormality,      then recommendation is to consider patient for cardiac catheterization.      We will, however, defer this assessment and final decision to the      patient's primary cardiologist, Dr. Dietrich Pates.  If the echocardiogram      does not suggest any new wall motion abnormalities, then consideration      may be given to proceeding with a followup stress test.  Again, this      will be deferred to Dr. Dietrich Pates in the morning.  2.  The patient will be continued on his current home medication regimen;      however, Altace will be up titrated to 5      mg b.i.d. for better management of hypertension.  In the interim, the      patient will be placed on intravenous nitroglycerin and Lovenox while      serial cardiac markers are cycled.   The patient was seen and examined in conjunction with Dr. Rollene Rotunda.      GS/MEDQ  D:  05/19/2004  T:  05/19/2004  Job:  [id number]   cc:   Madelin Rear. Sherwood Gambler, MD  [address] [place]  [place]  [address]  Fax:  [phone]   [place] Bing, M.D.

## 2010-08-14 NOTE — H&P (Signed)
NAMEKELLAN, [name] NO.:  [id number]   MEDICAL RECORD NO.:  [id number]          PATIENT TYPE:  AMB   LOCATION:  DAY                           FACILITY:  [facility]   PHYSICIAN:  Vickki Hearing, M.D.DATE OF BIRTH:  [birth year]   DATE OF ADMISSION:  DATE OF DISCHARGE:  LH                                HISTORY & PHYSICAL   CHIEF COMPLAINT:  Right shoulder pain.   HISTORY:  This is a 77 year old male who has a history of previous open  heart surgery, takes Terazosin 5 mg, Zocor 20 mg, metoprolol 50 mg,  Lisinopril with hydrochlorothiazide 20/10 mg, and a Bayer aspirin which was  stopped, has a history of arthritis, hypercholesterolemia, hypertension,  enlarged gland.  Family history of heart disease, arthritis, cancer.  Is  married, works in his garden, has no social habits, and has a sixth grade  education, and is followed by the [employer] and presented to me  on September 24, 2004, with right shoulder pain.  He complains of moderate,  constant, dull, aching pain with a grinding sensation in the right shoulder,  worsened by attempted elevation of the limb.  There is weakness of the right  arm and his pain increased over a one to two week period after being  quiescent for several years.  At his request, despite my recommendation for  a shoulder replacement, he received one injection on September 24, 2004, it did  not help.  So, he presented back again for further treatment, and this time  wished to go ahead with a glenohumeral arthroplasty of the right shoulder.  The primary reason was pain.  We went over the risks and benefits of the  procedure.  He understands that he may not get motion or complete strength  back, but his pain will be better.   REVIEW OF SYSTEMS:  He has a history of weight loss, shortness of breath,  unsteady gait, joint swelling, poor vision, tinnitus, hoarseness, eye  implant on the left, sinus problems.  His review of systems up to 10  systems  reviewed, he reported as normal.   PHYSICAL EXAMINATION:  VITAL SIGNS:  Weight is 161, pulse is 70, respiratory  rate is 18, blood pressure will be checked the day of surgery.  GENERAL:  Normal development, grooming, hygiene, nutrition, body habitus is  normal size.  PSYCHIATRIC:  He is alert and oriented x3.  NEUROLOGIC:  Normal sensation.  He has normal reflexes.  CARDIOVASCULAR:  Pulses are normal.  He has no venous stasis, temperature  changes, or edema.  SKIN:  Normal.  EXTREMITIES:  His right shoulder shows 80 degrees of flexion with grinding  sensation noted actively.  Passively, it is approximately 130.  His external  rotation is limited to 25 degrees with the arm at his side.  There is  definite crepitance noted.  Supraspinatus function estimated 0/5, difficult  to test, has a positive drop test, external rotation is approximately 4/5.  Otherwise, the joint is stable.  His opposite extremity is normal.  C-spine  is nontender.  No  motion loss, no increase in loss of ___________.  His  lower extremities have normal alignment, stability, and strength.   LABORATORY DATA:  Radiographs and MRI show rotator cuff tear, arthropathy of  the right shoulder.   DIAGNOSES:  1.  Rotator cuff tear.  2.  Arthropathy.  3.  Glenohumeral arthritis.   PLAN:  Hemiarthroplasty versus total arthroplasty of the right shoulder.  The patient will be admitted for one day.      Vickki Hearing, M.D.  Electronically Signed     SEH/MEDQ  D:  11/09/2004  T:  11/09/2004  Job:  [id number]

## 2010-08-14 NOTE — Op Note (Signed)
[place]  Patient:    [name], [name] Visit Number: [id number] MRN: [id number]          Service Type: MED Location: 2300 2302 01 Attending Physician:  Tressie Stalker Dictated by:   Salvatore Decent. Cornelius Moras, M.D. Proc. Date: 04/21/01 Admit Date:  04/19/2001   CC:         Thomas C. Daleen Squibb, M.D. [facility]  Maisie Fus D. Riley Kill, M.D. [facility]  Dr. Elfredia Nevins  CVTS office   Operative Report  PREOPERATIVE DIAGNOSIS:  Severe three vessel coronary artery disease with class 3 unstable angina.  POSTOPERATIVE DIAGNOSIS:  Severe three vessel coronary artery disease with class 3 unstable angina.  PROCEDURE:  Median sternotomy for coronary artery bypass grafting x6 (left internal mammary artery to distal left anterior descending coronary artery, saphenous vein graft to first diagonal branch, saphenous vein graft to first circumflex marginal branch and sequential saphenous vein graft to second circumflex marginal branch, saphenous vein graft to distal right coronary artery and sequential saphenous vein graft to distal posterior descending coronary artery).  SURGEON:  Salvatore Decent. Cornelius Moras, M.D.  ASSISTANT:  Lissa Merlin, P.A.  ANESTHESIA:  General.  BRIEF CLINICAL NOTE:  The patient is a 77 year old white male followed by Dr. Elfredia Nevins and referred by Dr. Juanito Doom and Dr. Bonnee Quin for management of coronary artery disease.  The patient has a history of hypertension, hyperlipidemia, remote history of heavy tobacco abuse, mild COPD, and a strong family history of coronary artery disease.  He presents with new onset symptoms of class 3 unstable angina.  Cardiac catheterization performed by Dr. Riley Kill demonstrates severe three vessel coronary artery disease with preserved left ventricular function.  A full consultation note has been dictated previously.  The patient and his family had been counseled at length regarding the indications and potential benefits of  coronary artery bypass grafting.  They understand and accept all associated risks of surgery including, but not limited to risks of death, stroke, myocardial infarction, respiratory failure, bleeding requiring blood transfusion, arrhythmia, infection, recurrent coronary artery disease.  All of their questions have been addressed.  Alternative treatment strategies have been discussed.  DESCRIPTION OF PROCEDURE:  The patient was brought to the operating room on the above mentioned date, and invasive hemodynamic monitoring was established by the anesthesia service under the care and direction of Dr. Kipp Brood. Specifically, a Swan-Ganz catheter was placed through the right internal jugular approach, and a radial arterial line was placed.  Intravenous antibiotics are administered.  The patient was placed in the supine position on the operating room.  Following induction with general endotracheal anesthesia, the patients chest, abdomen, both groins, and both lower extremities are prepared and draped in a sterile manner.  A median sternotomy incision is performed and the left internal mammary artery is dissected from the chest wall and prepared for bypass grafting.  The left internal mammary artery was dissected from the chest wall and prepared for bypass grafting.  The left internal mammary artery was noted to be a good quality conduit.  Simultaneously, saphenous vein was obtained from the patients right thigh and the upper portion of the right lower leg using endoscopic vein harvest technique.  The saphenous vein was noted to be a good quality conduit.  The patient was heparinized systemically.  The pericardium was opened.  The ascending aorta is inspected.  There was severe hard calcified plaque involving the distal portion of the ascending aorta and the transverse aorta arch.  This  large plaque seems to stop just proximal to the level of the takeoff of the innominate artery.  The  proximal portion of the ascending aorta is soft and appears more normal.  The ascending aorta is cannulated somewhat low on the ascending aorta due to the severe calcified plaque.  The right atrium was cannulated for cardiopulmonary bypass. Adequate heparinization is verified.  Cardiopulmonary bypass is begun and the surface of the heart is inspected. Distal sites are selected for coronary artery bypass grafting.  Portion of the saphenous vein and the left internal mammary artery are trimmed to appropriate lengths.  A temperature probe is placed in the left ventricular septum and a cardioplegia catheter was placed in the ascending aorta.  The patient was cooled to 28 degrees systemic temperature.  The aortic cross-clamp was applied and cardioplegia was delivered in an antegrade fashion through the aortic root.  Ice saline slush was applied for topical hypothermia.  The initial cardioplegic arrest and myocardial cooling are felt to be excellent.  Repeat doses of cardioplegia are administered intermittently throughout the cross-clamp portion of the operation, both through the aortic root and down subsequently placed vein grafts to maintain septal temperature below 15 degrees centigrade.  The following distal coronary anastomoses are performed:  #1 - The distal right coronary artery is graft with the saphenous vein graft in a side-to-side fashion.  This coronary artery measures 2.0 mm in diameter and is of good quality at the site of distal bypass.  This anastomosis is placed immediately at the takeoff of the posterior descending coronary artery.  #2 - The distal posterior descending coronary artery is grafted using the sequential saphenous vein graft off of the vein placed at the distal right  coronary artery.  This anastomosis was placed fairly distal along the intraventricular septum due to diffuse disease in the midportion of the posterior descending coronary artery.  At the site of  distal bypass, this coronary measures 1.4 mm in diameter and is of good quality.  #3 - The first circumflex marginal branch is grafted with the saphenous vein graft in a side-to-side fashion.  This coronary measures 1.8 mm in diameter and is of good quality.  #4 - The second circumflex marginal branch is grafted using a sequential saphenous vein graft off of the vein placed at the first circumflex marginal branch.  The second circumflex marginal branch is a bifurcating artery and the lateral subbranch which is grafted.  The medial subbranch is too small and diffusely diseased for grafting.  The lateral subbranch measures 1.4 mm at the site of distal bypass and is of fair quality.  #5 - The diagonal branch off the left anterior descending coronary artery was grafted with the saphenous vein graft in an end-to-side fashion.  This coronary measures 1.5 mm in diameter and is of good quality.  #6 - The distal left anterior descending coronary artery is grafted with the left internal mammary artery in an end-to-side fashion.  This coronary measures 1.8 mm in diameter and is of good quality.  All three proximal saphenous vein anastomoses are performed directly to the ascending aorta prior to removal of the aortic cross-clamp.  The septal temperature is noted to rise rapidly and dramatically upon reperfusion of the left internal mammary artery.  The patient is placed in the Trendelenburg position, and all air is evacuated from the aortic root.  The aortic cross-clamp is removed after a total cross-clamp time of 86 minutes.  The heart began to beat spontaneously without  need for cardioversion.  All proximal and distal anastomoses are inspected for hemostasis and appropriate graft orientation.  After cardiac pacing wires were affixed to the right ventricular outflow tract and to the right atrial appendage.  The patient was rewarmed to greater than 37 degrees centigrade temperature.  The patient  was weaned from cardiopulmonary bypass without difficulty.  The patients rhythm at separation from bypass was normal sinus rhythm.  No inotropic support is required.  Total cardiopulmonary bypass time for the operation is 103 minutes.  The venous and arterial cannulae are removed uneventfully.  Protamine was administered to reverse the anticoagulation.  The mediastinum and the left chest were irrigated with saline solution containing vancomycin.  Meticulous surgical hemostasis was ascertained.  The mediastinum and the left chest are drained with three chest tubes placed through separate stab incisions inferiorly.  The deep subcutaneous tissues of the right thigh are drained with a 19-French Blake drain.  The median sternotomy incision is closed in a routine fashion.  The right lower extremity incisions are closed in multiple layers in the routine fashion.  All skin incisions are closed with subcuticular skin closure.  The patient tolerated the procedure well and was transported to the surgical intensive care unit in stable condition.  There were no intraoperative complications.  All sponge, instrument, and needle counts were verified correct at completion of the operation.  No blood products were administered. Dictated by:   Salvatore Decent Cornelius Moras, M.D. Attending Physician:  Tressie Stalker DD:  04/21/01 TD:  04/23/01 Job: [id number]

## 2010-08-14 NOTE — Cardiovascular Report (Signed)
[place]. [facility]  Patient:    [name], [name] Visit Number: [id number] MRN: [id number]          Service Type: CAT Location: 2000 2005 01 Attending Physician:  Ronaldo Miyamoto Dictated by:   Arturo Morton Riley Kill, M.D. [facility] Proc. Date: 04/19/01 Admit Date:  04/19/2001   CC:         Artis Delay, M.D.  Thomas C. Wall, M.D. [facility]  CV Laboratory   Cardiac Catheterization  INDICATIONS: The patient is a pleasant 77 year old, who had recent episode of exertional type chest discomfort. He was seen by Dr. Daleen Squibb and subsequently referred for diagnostic cardiac catheterization.  PROCEDURES: 1. Left heart catheterization. 2. Selective coronary arteriography. 3. Selective left ventriculography. 4. Subclavian angiography.  DESCRIPTION OF PROCEDURE: The procedure was performed from the right femoral artery using 6 French catheters.  He tolerated the procedure without complication.  HEMODYNAMICS: The central aortic pressure was 186/79, LV pressure 162/10. There was no gradient on pullback across the aortic valve.  ANGIOGRAPHIC DATA: 1. The left main coronary artery was free of critical disease. 2. The left anterior descending artery has about a 30-40% proximal    lesion. In the mid vessel, there is about a 70-80% lesion beyond the    origin of the diagonal. The distal vessel has some luminal irregularity    but is suitable for grafting. The first diagonal itself has about a    90% proximal to mid stenosis. It is also suitable for grafting. 3. The circumflex provides the first marginal branch and has about 60%    mid narrowing. The AV circumflex has tandem lesions of 80% and then a long    90% stenosis. 4. The right coronary artery is a dominant vessel. There is diffuse disease    of the PDA with 70% and 80% lesions, and a 50% lesion of the posterolateral    branch.  LEFT VENTRICULOGRAPHY: Ventriculography in the RAO projection reveals hypokinesis of the  mid inferior wall. There is no significant mitral regurgitation.  The subclavian is widely patent as is the internal mammary.  The vertebral is calcified at the ostium with about 80% narrowing.  CONCLUSIONS: 1. Preserved left ventricular function. 2. Three-vessel coronary artery disease. 3. Stenosis of the vertebral arteries as described above.  DISPOSITION: I will review the films with my colleagues. Final disposition will be made after this is reviewed. Dictated by:   Arturo Morton Riley Kill, M.D. [facility] Attending Physician:  Ronaldo Miyamoto DD:  04/19/01 TD:  04/20/01 Job: [id number]

## 2010-08-14 NOTE — Discharge Summary (Signed)
. [facility]  Patient:    [name], [name] Visit Number: [id number] MRN: [id number]          Service Type: MED Location: 2000 2009 01 Attending Physician:  Tressie Stalker Dictated by:   Areta Haber, Rochel Brome. Admit Date:  04/19/2001 Discharge Date: 04/28/2001   CC:         Salvatore Decent. Cornelius Moras, M.D.  Arturo Morton. Riley Kill, M.D. [facility]  Maisie Fus C. Wall, M.D. [facility]   Discharge Summary  HISTORY OF PRESENT ILLNESS:  This is a 77 year old male with no previous cardiac history, who had multiple risk factors including hypertension, hyperlipidemia, and remote history of tobacco use with a strong family history of coronary artery disease.  He presented to Dr. Adalberto Ill approximately two weeks prior to admission with new onset symptoms of class IV exertional angina which developed while he was at work at the [employer] center in [place].  The patient developed chest pain described as a tightness across his anterior chest associated with physical activity which was relieved by rest. The pain was associated with mild shortness of breath and diaphoresis. He had not had any prior episodes of similar chest pain and he reports he has not had any significant episodes since that time.  He was initially evaluated by Jesse Sans. Wall, M.D. [facility] on April 13, 2001, and was admitted this hospitalization for cardiac catheterization.  REVIEW OF SYSTEMS:  Please see history and physical.  PAST MEDICAL HISTORY:  Notable for hypertension, hyperlipidemia, remote history of heavy tobacco use.  PAST SURGICAL HISTORY:  Multiple surgical procedures done in the left eye including cataract removal and lens implant.  FAMILY HISTORY:  Please see the history and physical.  SOCIAL HISTORY:  Please see the history and physical.  MEDICATIONS: 1. Altace 5 mg q.d. 2. Hytrin 5 mg q.d. 3. Leschol 30 mg once daily. 4. Aspirin 325 mg once daily. 5. Prednisone eyedrops twice  daily.  ALLERGIES:  No known drug allergies.  PHYSICAL EXAMINATION:  Please see the history and physical done at the time of admission.  HOSPITAL COURSE:  The patient was admitted electively for cardiac catheterization where he was found to have severe three vessel coronary artery disease and preserved left ventricular function.  Cardiac surgical consultation was obtained with Salvatore Decent. Cornelius Moras, M.D. who evaluated the patient and his studies and agreed to proceed with the recommendations for coronary surgical revascularization procedure.  On April 21, 2001, the patient was taken to the operating room where he underwent the following procedure; coronary artery bypass grafting x 6.  The following grafts were placed:  1) Sequential saphenuos vein graft to the distal right coronary artery and posterior descending coronary artery.  2) Sequential saphenuos vein graft to obtuse marginal 1 and 2.  3) Saphenuos vein graft to diagonal. 4) Left internal mammary artery to the LAD.  Crossclamp time was 86 minutes. Pump time was 103 minutes.  The patient required no blood products and came off cardiopulmonary bypass in normal sinus rhythm without inotropic agents. The patient was taken to the surgical intensive care unit in stable condition.  Postoperatively, the patient developed rapid atrial fibrillation postoperative evening.  Hemodynamically remained stable and was chemically cardioverted with amiodarone bolus back to a normal sinus rhythm.  The patient also had recurrence with a rapid supraventricular tachycardia which was atrial paced back to a normal sinus rhythm and medications were adjusted.  The last occurrence of the atrial fibrillation has been on April 26, 2001, and the patient is currently felt to be stable in regard on current regimen.  The patient was weaned from the ventilator without difficulty.  Oxygen has been weaned and he maintains good saturations on room air.  The patient  is noted to have required a chest tube placement on the right side for a large right pneumothorax which developed on April 24, 2001.  This has subsequently been discontinued and chest x-ray has remained stable with no recurrence of the pneumothorax.  The patient is noted to be on Coumadin for the atrial fibrillation as part of his regimen and daily INRs have been monitored.  He has an INR of 2.1 on April 28, 2001, and his initial home dose appears to be adequate at 5 mg daily with the INR to be checked on May 02, 2001, through the [facility] to make any adjustments.  Currently, the patient is felt to be quite stable.  His incisions are healing well without signs of infection.  He is tolerating diet and routine activities commensorate for level of postoperative convalescence following the cardiac rehabilitation phase I modalities.  His laboratory values appear to be stable with a postoperative anemia.  Most recent hemoglobin and hematocrit 8.5 and 24.8, respectively.  He has responded well to diuresis overall with no significant signs of congestive heart failure.  He is felt to be stable for discharge and will be done at this time on April 28, 2001.  DISCHARGE MEDICATIONS:  1. Enteric-coated aspirin 81 mg daily.  2. Tylox one to two q.4-6h. p.r.n.  3. Lopressor 50 mg q.8h.  4. Coumadin 5 mg daily as instructed.  5. Lasix 40 mg daily for five days.  6. Potassium chloride 20 mEq daily for five days.  7. Amiodarone 400 mg daily.  8. Hytrin 5 mg daily at bedtime.  9. Niferex 150 mg daily. 10. Altace 5 mg daily.  DISCHARGE INSTRUCTIONS:  The patient received written instructions regarding medications, activity, diet, wound care, and follow-up.  FOLLOW-UP:  Dr. Daleen Squibb in two weeks.  [facility] on May 02, 2001.  Dr. Cornelius Moras on February 24, at 9:45 a.m.   FINAL DIAGNOSES: 1. Severe multivessel coronary artery disease. 2. Hypertension. 3. Hyperlipidemia. 4.  History of heavy tobacco abuse. 5. History of previous surgery of the left eye. 6. Postoperative anemia. 7. Postoperative pneumothorax, resolved. Dictated by:   Areta Haber, Rochel Brome. Attending Physician:  Tressie Stalker DD:  04/28/01 TD:  04/28/01 Job: [id number]

## 2010-08-14 NOTE — Discharge Summary (Signed)
NAMEYOSHIO, SELIGA NO.:  [id number]   MEDICAL RECORD NO.:  [id number]          PATIENT TYPE:  INP   LOCATION:  A318                          FACILITY:  [facility]   PHYSICIAN:  Vickki Hearing, M.D.DATE OF BIRTH:  1924/03/13   DATE OF ADMISSION:  11/10/2004  DATE OF DISCHARGE:  08/15/2006LH                                 DISCHARGE SUMMARY   ADMISSION DIAGNOSIS:  Rotator cuff tear arthropathy, right shoulder.   DISCHARGE DIAGNOSIS:  Rotator cuff tear arthropathy, right shoulder.   PROCEDURE:  Cuff tear arthropathy hemiarthroplasty, right shoulder.   HOSPITAL COURSE:  This is a 77 year old male who was admitted to the  hospital for surgery on his right shoulder. Underwent a hemiarthroplasty of  the right shoulder with a cuff tear arthropathy prosthesis from [employer]. He  was admitted. His pain level was between 0 and 2 on postoperative day #1. He  was comfortable, and he was allowed to be discharged home.   OPERATIVE FINDINGS:  Complete tear of his rotator cuff, severe contracture  of the subscapularis tendon, proximal migration and false articulation with  the acromion.   DISCHARGE DISPOSITION:  Home.   CONDITION:  Overall stable.   DISCHARGE MEDICATIONS:  1.  Lorcet Plus 1 every 4 hours as needed.  2.  Skelaxin 800 mg every 8 hours as needed.   FOLLOW UP:  PT consult was obtained to be started next Monday. Followup will  be August 23 at 11 a.m. At that time, a dressing change and radiograph will  be obtained.      Vickki Hearing, M.D.  Electronically Signed     SEH/MEDQ  D:  11/11/2004  T:  11/11/2004  Job:  [id number]

## 2010-08-14 NOTE — Discharge Summary (Signed)
NAMECHRISTOS, [name] NO.:  [id number]   MEDICAL RECORD NO.:  [id number]          PATIENT TYPE:  INP   LOCATION:  4702                         FACILITY:  [facility]   PHYSICIAN:  Rollene Rotunda, M.D.   DATE OF BIRTH:  1924/01/08   DATE OF ADMISSION:  05/19/2004  DATE OF DISCHARGE:  05/20/2004                           DISCHARGE SUMMARY - REFERRING   PROCEDURE:  2D echocardiogram.   REASON FOR ADMISSION:  [name] is a 77 year old male status post bypass  surgery January 2003 followed by Dr. Marianna Bing, who presented to the  emergency room  directly from Dr. Sharyon Medicus office for evaluation of chest  pain and abnormal electrocardiogram.  Please refer to admission note for  full details.   LABORATORY DATA:  Normal serial cardiac markers.  TSH 1.59.  Potassium 3.9,  glucose 140, BUN 12, creatinine 1 on admission.  Normal liver enzymes.  Normal WBC, hematocrit, with decreased platelets 122, 130, in follow up.  Admission chest x-ray showed emphysema.   HOSPITAL COURSE:  Following presentation to the emergency room for  evaluation of chest pain and an abnormal electrocardiogram suggestive of  possible new Q-wave in the precordial leads, the patient was admitted by Dr.  Antoine Poche for further evaluation and management.  Serial cardiac markers were  normal.  A 2D echocardiogram was done for reassessment of LV function and to  exclude any new wall motion abnormalities.  This was reviewed by Dr. Olga Millers and no definite wall motion abnormalities were noted; ejection  fraction was normal at 55-65%.  Dr. Jens Som recommended no further in house  cardiac workup; the patient will, however, be scheduled for an outpatient  stress Cardiolite in [place].  Of note, medications were adjusted on  admission - Altace titrated to 5 b.i.d. for better management of blood  pressure.  Dr. Jens Som recommended a follow up metabolic profile in one  week.   DISCHARGE MEDICATIONS:   Altace 5 mg b.i.d., aspirin 81 mg daily, Metoprolol  50 mg b.i.d., Zocor 20 mg q.h.s., Hytrin 5 mg q.h.s., Naprosyn 500 mg daily.   DISCHARGE INSTRUCTIONS:  1.  Follow up outpatient exercise stress Myoview in one week - arrangements      to be made through our [place] office.  2.  Follow up metabolic profile in one week for monitoring of hypokalemia      and renal function.  3.  Follow up with Dr. Elspeth Cho, P.A.-C., in two weeks -      arrangements to be made through our office.  4.  Follow up with Dr. Elfredia Nevins as scheduled.   DISCHARGE DIAGNOSIS:  1.  Atypical chest pain.      1.  Normal cardiac markers.      2.  Normal left ventricular function by 2D echocardiogram.  2.  Coronary artery disease.      1.  Six vessel coronary artery bypass grafting in January 2003.      2.  Non-ischemic stress Cardiolite in January 2004.  3.  Hypertension.  4.  Dyslipidemia.  5.  Mild thrombocytopenia.  6.  Chronic obstructive pulmonary disease.  7.  Remote tobacco.      GS/MEDQ  D:  05/20/2004  T:  05/20/2004  Job:  [id number]   cc:   Madelin Rear. Sherwood Gambler, MD  [address]  [place]  [place] [address]  Fax: [phone]

## 2010-08-14 NOTE — Procedures (Signed)
NAMEJESSIE, SCHRIEBER NO.:  [id number]   MEDICAL RECORD NO.:  [id number]         PATIENT TYPE:  REC   LOCATION:                                FACILITY:  [facility]   PHYSICIAN:  Vida Roller, M.D.   DATE OF BIRTH:  02-17-1924   DATE OF PROCEDURE:  DATE OF DISCHARGE:                                    STRESS TEST   HISTORY:  Mr. Calandra is a 77 year old gentleman with known coronary artery  disease status post coronary artery bypass grafting in January 2003 with the  following grafts placed: LIMA to LAD, SVG to diagonal, SVG to OMI and OMII,  SVG to RCA, PDA.  He had an exercise Cardiolite in January 2004 revealing no  ischemia, fixed inferior defect, and an EF of 71%.  The patient was recently  hospitalized secondary to chest discomfort.  He ruled out for acute  myocardial infarction with enzymes.  He had an echocardiogram revealing a  normal EF but no wall motion abnormalities.  He was discharged home and  scheduled for an outpatient Cardiolite for further evaluation.  He has no  recurrent chest discomfort since discharge from the hospital.   BASELINE DATA:  EKG reveals sinus bradycardia at 59 beats/minute with  nonspecific ST abnormalities and poor R wave progression.  Blood pressure is  168/72.   DESCRIPTION OF PROCEDURE:  The patient exercised for a total of 6 minutes  and 18 seconds and Bruce protocol stage II and 7.0 METS.  Maximum heart rate  was 127 beats/minute, which is 89% of predicted maximum.  Maximum blood  pressure was 210/80 and resolved down to 158/72.  Exercise was stopped  secondary to elevated blood pressure.  EKG revealed frequent PACs.  No  ischemic changes were noted.  However, there was a lot of artifact in the  tracings.   Myoview was injected one minute prior to cessation of exercise.  The patient  reported mild shortness of breath with exercise.  However, no chest  discomfort was noted.   The final images and results are pending M.D.  review.      AB/MEDQ  D:  05/25/2004  T:  05/25/2004  Job:  [id number]

## 2010-08-14 NOTE — Procedures (Signed)
NAMEBRENON, [name] NO.:  [id number]   MEDICAL RECORD NO.:  [id number]                   PATIENT TYPE:   LOCATION:                                       FACILITY:   PHYSICIAN:  Vida Roller, M.D.                DATE OF BIRTH:  04-19-1923   DATE OF PROCEDURE:  04/19/2002  DATE OF DISCHARGE:                                    STRESS TEST   REASON FOR EVALUATION:  Status post coronary artery bypass grafts in  January.   EXERCISE RESULTS:  The patient exercised on a Bruce Protocol approximately 7-  1/2 minutes obtaining 10 minutes of exercise.  At that time, his resting  heart rate was 49.  His maximum achieved heart rate was 127 which is 88% of  maximum predicted for his age.  His resting systolic blood pressure was 134  and this increased to 216 given exercise double product of 23.6.  During  exercise, he experienced some shortness of breath which resolved during  rest.  The electrocardiographic images show an occasional PAC, but no  ischemic changes.  There is approximately 0.5 mm of ST segment depression  which is upsloping.  The reason for stopping the test was the shortness of  breath and fatigue and obtaining the maximum predicted heart rate.  Of note,  the ST changes resolved in recovery.   Perfusion nuclear images were obtained using Cardiolite and 10 mCi were  injected at rest and 30 mCi were injected at peak stress.  The heart was  imaged in the SPECT format tomographically in the short axis, vertical long  axis and horizontal long axis.  The poststress images were used to obtain a  left ventricular ejection fraction and wall motion analysis.   RESULTS:  There is no motion artifact, but there is significant uptake in  the right ventricle.  The tomographic images reveal a small fixed defect in  the inferior wall from mid ventricle to apex consistent with an old  myocardial infarction.  There are no obvious ischemic changes seen.  Poststress gated images show an ejection fraction of 71% with mild  hypokinesis in the inferior septum consistent with a fixed defect seen on  the perfusion imaging.   ASSESSMENT:  1. This is a gentleman with known coronary artery disease, status post     bypass grafting who has no active ischemia.  2. Preserved left ventricular ejection fraction with mild wall motion     abnormality consistent with an old myocardial infarction.                                               Vida Roller, M.D.    JH/MEDQ  D:  04/19/2002  T:  04/20/2002  Job:  [id number]  

## 2010-08-14 NOTE — Op Note (Signed)
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   PROCEDURE:  Right shoulder hemiarthroplasty with rotator cuff tear  arthroplasty humeral head.   OPERATIVE FINDINGS:  Complete tear of rotator cuff except the subscapularis  which was contracted internally.   PRIMARY INDICATION:  Pain.   SURGEON:  Dr. Romeo Apple.   ANESTHETIC:  General.   The patient was identified as [name] in the preop holding area, right  shoulder marked as a surgical site, signed by the surgeon. History and  physical updated. Antibiotics given. He was taken to surgery. He was placed  in the semiupright position after intubation. After sterile prep and drape,  a time-out was taken and completed as required. We infiltrated the subcu  tissue with dilute epinephrine lidocaine solution. We made an incision in  the deltopectoral interval, took it down through the subcutaneous tissue  until the cephalic vein was identified. We retracted that laterally. We then  dissected through the clavipectoral fascia, released the superior third of  the pectoralis major, found the lateral edge of the conjoined tendon,  palpated the musculocutaneous nerve, placed retractors and dissected until  the superior inferior portion of the subscapularis was identified with the  arm in maximal external rotation which was only approximately 35 degrees  under anesthesia. We released the subscapularis tendon and tagged it for  later repair. We subluxated the humeral head to forward  and examined the  head. It was severely deformed and sclerotic. The biceps tendon was also  ruptured. We then drilled posterior to the biceps tendon into the canal,  took a curette to enlarge the hole and then prepared the canal with reamers  and broaches up to a size 14. After trialing with a 14 and the findings  showed it to be loose, we tried a cuff tear arthropathy head. The shoulder  was still loose. We then rereamed the canal but left the reamer proud and  then passed the broach as a 16 proud and then put the cuff tear arthroplasty  head on, and that gave good stability. We then irrigated the wound, checked  the glenoid. Of course, there was very minimal glenoid surface left. It was  sclerotic. There was false articulation with the acromion. The  coracoacromial ligament was preserved.   We removed the trial implants, placed the real implants, tested the  shoulder. We got 20 degrees of external rotation, 83 degrees of abduction,  and 120 degrees of flexion. We irrigated the wound, closed the subscap back  to its original position with interrupted nonabsorbable suture. We closed  the deltopectoral interval and subcu tissue. We placed a  pain pump catheter in the subcu tissue. We injected deep space with  Marcaine, and we placed skin staples to close the skin. We placed in an  immobilizer and a CryoCuff. He was extubated and taken to recovery room in  stable condition. Wound, of course, was dressed sterilely.      Vickki Hearing, M.D.  Electronically Signed     SEH/MEDQ  D:  11/11/2004  T:  11/12/2004  Job:  [id number]

## 2010-08-14 NOTE — Consult Note (Signed)
[place]  Patient:    YEHUDAH, STANDING Visit Number: [id number] MRN: [id number]          Service Type: MED Location: [id number] Attending Physician:  Ronaldo Miyamoto Dictated by:   Salvatore Decent Cornelius Moras, M.D. Proc. Date: 04/20/01 Admit Date:  04/19/2001   CC:         Arturo Morton. Riley Kill, M.D. [facility]  Maisie Fus C. Wall, M.D. [facility]  Elfredia Nevins, M.D.  [facility] Office   Consultation Report  PHYSICIANS: Requesting physician: Arturo Morton. Riley Kill, M.D. [facility] Primary cardiologist: Jesse Sans. Daleen Squibb, M.D. [facility] Primary care physician: Elfredia Nevins, M.D.  REASON FOR CONSULTATION:  Severe three-vessel coronary artery disease with new onset angina.  HISTORY OF PRESENT ILLNESS:  Mr. Majid is a 77 year old white male with no previous cardiac history but risk factors including history of hypertension, hyperlipidemia, a remote history of heavy tobacco use, and a strong family history of coronary artery disease.  He presented to Dr. Sherwood Gambler approximately two weeks ago with new onset symptoms of class IV exertional angina which developed while he was at work at [employer] center in [place]. The patient developed chest pain described as a tightness across his anterior chest associated with physical activity which was relieved by rest.  The pain was associated with mild shortness of breath and diaphoresis.  He had not had any prior episodes of similar chest pain, and he reports that he has not had any significant episodes of similar chest pain since.  He was initially evaluated by Dr. Juanito Doom on April 13, 2001, and underwent elective cardiac catheterization yesterday by Dr. Bonnee Quin.  This demonstrated severe three-vessel coronary artery disease with preserved left ventricular function.  Cardiac surgical consultation was requested.  REVIEW OF SYSTEMS:  Cardiac: The patient denies any episodes of chest pain occurring at rest at with minimal activity.   He reports only mild exertional shortness of breath.  He denies any history of resting shortness of breath, PND, orthopnea, or lower extremity edema.  He has had occasional palpitations but denies any history of syncopal episodes or presyncopal spells.  General: Negative.  The patient reports good appetite with no recent weight loss or weight gain.  Respiratory: Essentially negative.  He does report a history of chronic bronchitis, although he denies any current ongoing cough or recent history of productive cough, hemoptysis, or wheezing. Gastrointestinal: Essentially negative.  The patient does report occasional constipation.  He denies any history of recent hematochezia, hematemesis, or melena.  He has had some bright red blood in the remote past but none recently.  Neurologic: Negative.  The patient denies any history of TIA or amaurosis fugax.  Musculoskeletal: Essentially negative.  He does report some mild pain in his left lower leg associated with ambulation.  He has some mild arthritis in his shoulder and both knees.  Genitourinary: Essentially negative, although history of mild difficulty starting his urinary stream.  He denies any dysuria, hematuria, or urinary frequency or urgency.  Infectious: Negative.  He denies recent fevers or chills.  Hematologic: Negative, although the patient does report occasional easy bruising.  He denies any episodes of frequent epistaxis or other bleeding diaphysis.  Endocrine: Negative. Psychiatric: Negative.  Peripheral vascular: Negative.  The patient denies symptoms suggestive of claudication.  PAST MEDICAL HISTORY:  Notable for hypertension, hyperlipidemia, and a remote history of heavy tobacco use.  PAST SURGICAL HISTORY:  Notable for multiple surgical procedures done on the left eye including cataract  removal, lens implant, and lens implantation.  FAMILY HISTORY:  Notable for a strong family history of coronary artery disease.  His father  died of heart attack at age 30, and he has a son who underwent coronary bypass at age 42.  SOCIAL HISTORY:  The patient lives with his wife.  He previously worked for [employer] for more than 30 years and has been retired for approximately 13 years.  He most recently has worked part time at [employer] center.  He is fairly active for his age physically.  He is married and has seven children and numerous grandchildren.  He denies any history of significant alcohol consumption.  He has a previous history of heavy tobacco use, smoking more than two packs of cigarettes per day for many years.  He quit smoking approximately 12 years ago.  MEDICATIONS PRIOR TO ADMISSION: 1. Altace 5 mg p.o. once daily. 2. Hytrin 5 mg p.o. once daily. 3. Lescol 20 mg p.o. once daily. 4. Aspirin 325 mg p.o. once daily. 5. Prednisone eye drops twice daily.  The patient was recently given a prescription for Toprol XL 50 mg once daily as well as a prescription for nitroglycerin sublingual tablets as needed for chest pain.  ALLERGIES:  The patient denies any known drug allergies or sensitivities.  PHYSICAL EXAMINATION:  GENERAL:  Well-appearing white male who appears somewhat younger than stated age in no acute distress.  VITAL SIGNS:  He is in normal sinus rhythm by telemetry monitor and is currently normotensive and afebrile.  HEENT:  Essentially within normal limits.  He is edentulous and wears full set of upper and lower dentures.  NECK:  Supple.  There is no cervical or supraclavicular lymphadenopathy. There is no jugular venous distension, and no carotid bruits are noted.  CHEST:  Auscultation of the chest reveals clear and symmetrical breath sounds bilaterally.  No wheezes or rhonchi are noted.   CARDIOVASCULAR:  Regular rate and rhythm.  No murmurs, rubs, or gallops are noted.  ABDOMEN:  Soft and nontender.  There are no palpable masses.  The liver edge is not enlarged.  Bowel  sounds are present.  EXTREMITIES:  Warm and well perfused.  There is no lower extremity edema. There are some chronic chronic scars on the anterior shins and both lower legs suggestive of old trauma.  There are no open skin lesions or ulcerations. Distal pulses are palpable in both lower legs at the ankle although mildly diminished on the right in comparison to the left.  There is no venous insufficiency, and there is no significant varicose vein.  There is no lower extremity edema.  RECTAL/GU:  Both exams are deferred.  NEUROLOGIC:  Grossly nonfocal and symmetrical throughout.  DIAGNOSTIC TESTS:  Cardiac catheterization performed January 22 is reviewed. This demonstrates severe three-vessel coronary artery disease with normal left ventricular function.  Specifically, there is 70% stenosis of the mid left anterior descending coronary artery with 90% proximal stenosis of a large diagonal branch.  The left circumflex coronary artery gives rise to a large first circumflex marginal branch which has 50 to 60% proximal stenosis.  There is a bifurcating second circumflex marginal branch which has a long 90 to 95% blockage proximally also involving the bifurcation.  There is right-dominant coronary circulation.  There is 30% proximal stenosis of the right coronary artery.  There is 70% proximal stenosis of the posterior descending coronary artery and 80% stenosis in the mid portion of this vessel.  Left ventricular function  appears preserved with ejection fraction estimated at 55%.  There was no mitral regurgitation.  IMPRESSION:  Severe three-vessel coronary artery disease with preserved left ventricular function and new onset symptoms of angina.  I believe that Mr. Millikan would certainly benefit from elective coronary artery bypass grafting.  PLAN:  I have outlined at length the indications and potential benefits of coronary artery bypass grafting with Mr. Sarafian.  He understands and accepts  all associated risks of surgery including but not limited to risks of death, stroke, myocardial infarction, bleeding requiring blood transfusion, arrhythmia, infection, recurrent coronary artery disease.  All of his questions have been addressed.  We tentatively plan to proceed with surgery first case tomorrow morning, although Mr. Trulson desires to meet and discuss this at length further later this afternoon with his wife and other family members when they are available. Dictated by:   Salvatore Decent Cornelius Moras, M.D. Attending Physician:  Ronaldo Miyamoto DD:  04/20/01 TD:  04/21/01 Job: [id number]

## 2010-09-09 ENCOUNTER — Other Ambulatory Visit: Payer: Self-pay | Admitting: Adult Health

## 2010-10-13 ENCOUNTER — Other Ambulatory Visit: Payer: Self-pay | Admitting: Cardiology

## 2010-12-04 ENCOUNTER — Other Ambulatory Visit: Payer: Self-pay | Admitting: Adult Health

## 2010-12-08 ENCOUNTER — Encounter: Payer: Self-pay | Admitting: Cardiology

## 2010-12-08 ENCOUNTER — Ambulatory Visit (INDEPENDENT_AMBULATORY_CARE_PROVIDER_SITE_OTHER): Payer: 59 | Admitting: Cardiology

## 2010-12-08 DIAGNOSIS — I679 Cerebrovascular disease, unspecified: Secondary | ICD-10-CM

## 2010-12-08 DIAGNOSIS — E785 Hyperlipidemia, unspecified: Secondary | ICD-10-CM | POA: Insufficient documentation

## 2010-12-08 DIAGNOSIS — I2510 Atherosclerotic heart disease of native coronary artery without angina pectoris: Secondary | ICD-10-CM

## 2010-12-08 DIAGNOSIS — M1990 Unspecified osteoarthritis, unspecified site: Secondary | ICD-10-CM

## 2010-12-08 DIAGNOSIS — I4891 Unspecified atrial fibrillation: Secondary | ICD-10-CM

## 2010-12-08 DIAGNOSIS — F17201 Nicotine dependence, unspecified, in remission: Secondary | ICD-10-CM

## 2010-12-08 DIAGNOSIS — I480 Paroxysmal atrial fibrillation: Secondary | ICD-10-CM | POA: Insufficient documentation

## 2010-12-08 DIAGNOSIS — I10 Essential (primary) hypertension: Secondary | ICD-10-CM | POA: Insufficient documentation

## 2010-12-08 MED ORDER — AMLODIPINE BESYLATE 5 MG PO TABS: 5.0000 mg | ORAL_TABLET | Freq: Every day | ORAL | Status: DC

## 2010-12-08 NOTE — Patient Instructions (Signed)
**Note De-identified  Obfuscation**   **Note De-Identified  Obfuscation** Follow up with Dr. Dietrich Pates in 1 year.  Blood pressure check with nurse in 2 weeks.  Your physician has requested that you regularly monitor and record your blood pressure readings at home. Please use the same machine at the same time of day to check your readings and record them to bring to your follow-up visit. Start Norvasc (amlodipine) 5 mg daily.  Go to the lab to have the following lab work done. CBC, CMET, TSH, FLP. DO NOT EAT OR DRINK AFTER MIDNIGHT.

## 2010-12-09 ENCOUNTER — Other Ambulatory Visit: Payer: Self-pay | Admitting: Adult Health

## 2010-12-09 LAB — CBC WITH DIFFERENTIAL/PLATELET
Basophils Absolute: 0 10*3/uL (ref 0.0–0.1)
Eosinophils Relative: 2 % (ref 0–5)
Lymphocytes Relative: 23 % (ref 12–46)
Lymphs Abs: 1.3 10*3/uL (ref 0.7–4.0)
MCV: 95.1 fL (ref 78.0–100.0)
Neutro Abs: 3.9 10*3/uL (ref 1.7–7.7)
Platelets: 156 10*3/uL (ref 150–400)
RBC: 4.26 MIL/uL (ref 4.22–5.81)
RDW: 13.8 % (ref 11.5–15.5)
WBC: 5.9 10*3/uL (ref 4.0–10.5)

## 2010-12-09 LAB — COMPREHENSIVE METABOLIC PANEL
CO2: 30 mEq/L (ref 19–32)
Calcium: 9.5 mg/dL (ref 8.4–10.5)
Chloride: 104 mEq/L (ref 96–112)
Creat: 1.29 mg/dL (ref 0.50–1.35)
Glucose, Bld: 90 mg/dL (ref 70–99)
Total Bilirubin: 0.6 mg/dL (ref 0.3–1.2)
Total Protein: 7.2 g/dL (ref 6.0–8.3)

## 2010-12-09 LAB — LIPID PANEL
Cholesterol: 140 mg/dL (ref 0–200)
HDL: 42 mg/dL (ref >39)
LDL Cholesterol: 85 mg/dL (ref 0–99)
Triglycerides: 64 mg/dL (ref <150)

## 2010-12-09 LAB — TSH: TSH: 1.812 u[IU]/mL (ref 0.350–4.500)

## 2010-12-09 NOTE — Assessment & Plan Note (Signed)
Blood pressure control is quite suboptimal today.  Amlodipine 5 mg q.d will be added to his medical regime with the suggestion that he follow home blood pressures and return in 2 weeks for a blood pressure assessment by the cardiology nurses.

## 2010-12-09 NOTE — Assessment & Plan Note (Signed)
Patient has no symptoms at present to suggest myocardial ischemia.  We will continue to optimally manage cardiovascular risk factors.

## 2010-12-09 NOTE — Assessment & Plan Note (Signed)
[name] is using 2 different nonsteroidal preparations, nabumetone on a routine basis and meloxicam as needed, perhaps once every 3 days.  Since the dose of the former is relatively low and he uses the latter only occasionally, this regimen appears to be reasonably safe with appropriate monitoring of renal function.

## 2010-12-09 NOTE — Assessment & Plan Note (Signed)
Most recent lipid profile was excellent, but was obtained nearly 2 years ago.  A repeat study will be obtained as well as a metabolic profile, CBC and TSH.

## 2010-12-09 NOTE — Progress Notes (Signed)
HPI : Return visit for this very pleasant 77 year old gentleman with coronary disease, cerebrovascular disease and multiple cardiovascular risk factors.  Since his last visit, he has done generally well, experiencing no chest discomfort nor dyspnea.  He requires nonsteroidal treatment for chronic pain in the right hip and experiences leg fatigue with fairly modest exercise.  Current Outpatient Prescriptions on File Prior to Visit  Medication Sig Dispense Refill  . aspirin 81 MG tablet Take 81 mg by mouth daily.        Marland Kitchen lisinopril-hydrochlorothiazide (PRINZIDE,ZESTORETIC) 20-12.5 MG per tablet Take 2 tablets by mouth daily.        . metoprolol (LOPRESSOR) 50 MG tablet TAKE 1 TABLET BY MOUTH TWICE DAILY  60 tablet  5  . nabumetone (RELAFEN) 500 MG tablet Take 500 mg by mouth 2 (two) times daily.        . simvastatin (ZOCOR) 20 MG tablet TAKE 1 TABLET BY MOUTH EVERY DAY  30 tablet  0  . terazosin (HYTRIN) 2 MG capsule Take 2 mg by mouth daily.       Marland Kitchen amLODipine (NORVASC) 5 MG tablet Take 1 tablet (5 mg total) by mouth daily.  30 tablet  11     No Known Allergies    Past medical history, social history, and family history reviewed and updated.  ROS: Denies orthopnea, PND, pedal edema, lightheadedness or syncope.  No cough nor sputum production.  PHYSICAL EXAM: BP 194/73  Pulse 58  Resp 18  Ht 5\' 9"  (1.753 m)  Wt 74.39 kg (164 lb)  BMI 24.22 kg/m2  SpO2 98% ; Repeat BP 180/70 General-Well developed; no acute distress Body habitus-proportionate weight and height Neck-No JVD; right carotid bruits Lungs-clear lung fields; resonant to percussion Cardiovascular-normal PMI; normal S1 and S2; fourth heart sound Abdomen-normal bowel sounds; soft and non-tender without masses or organomegaly Musculoskeletal-No deformities, no cyanosis or clubbing Neurologic-Normal cranial nerves; symmetric strength and tone Skin-Warm, no significant lesions Extremities-distal pulses intact; no  edema  ASSESSMENT AND PLAN:

## 2010-12-09 NOTE — Assessment & Plan Note (Signed)
Recent carotid ultrasound reveals no significant focal internal carotid artery stenosis although substantial plaque is present as well as progression of disease in the external carotids.  Continued surveillance is appropriate.

## 2010-12-22 ENCOUNTER — Ambulatory Visit: Payer: 59

## 2010-12-22 VITALS — BP 134/64 | HR 62 | Ht 69.0 in | Wt 155.0 lb

## 2010-12-22 DIAGNOSIS — I10 Essential (primary) hypertension: Secondary | ICD-10-CM

## 2010-12-22 NOTE — Progress Notes (Signed)
S: Pt. arrives in office for a 2 week BP check with nurse. B: On last OV with Dr. Dietrich Pates on 12-08-10 pt. was advised to start taking Amlodipine 5 mg daily for suboptimal BP, keep BP diary, have CMET, CBC, TSH and lipids drawn. A: Pt. has no cardiac complaints at this time. His BP this morning is 134/64 and at last OV BP was194/73. He did bring in a list of his medications and states that he is taking as directed. He also brought in BP diary (placed in Dr. Marvel Plan reports folder for his review). Pt. did have labs drawn, all results were within normal range. R: Pt. Advised to continue with current medical treatment and that we will contact him with Dr. Marvel Plan recommendations, if any.

## 2010-12-31 NOTE — Progress Notes (Signed)
BP values ranged 113-177 systolic with 1/13 >170 and 3/13 > 145; all diastolics<80. Adequate BP control->continue current Rx.

## 2011-01-05 ENCOUNTER — Encounter: Payer: Self-pay | Admitting: Cardiology

## 2011-01-14 LAB — CBC
Hemoglobin: 15.2
MCHC: 34.4
Platelets: 181
RDW: 12.8

## 2011-01-14 LAB — COMPREHENSIVE METABOLIC PANEL
AST: 27
CO2: 27
Calcium: 9.4
Creatinine, Ser: 1.13
GFR calc Af Amer: >60
GFR calc non Af Amer: >60

## 2011-01-14 LAB — URINALYSIS, ROUTINE W REFLEX MICROSCOPIC
Bilirubin Urine: NEGATIVE
Glucose, UA: NEGATIVE
Ketones, ur: NEGATIVE
Specific Gravity, Urine: 1.015
pH: 6

## 2011-01-14 LAB — DIFFERENTIAL
Eosinophils Relative: 1
Lymphocytes Relative: 19
Lymphs Abs: 1.4
Neutrophils Relative %: 72

## 2011-01-14 LAB — POCT CARDIAC MARKERS
Operator id: 267301
Troponin i, poc: <0.05

## 2011-01-14 LAB — URINE MICROSCOPIC-ADD ON

## 2011-01-15 ENCOUNTER — Telehealth: Payer: Self-pay | Admitting: Cardiology

## 2011-01-15 MED ORDER — LISINOPRIL-HYDROCHLOROTHIAZIDE 20-12.5 MG PO TABS: 2.0000 | ORAL_TABLET | Freq: Every day | ORAL | Status: DC

## 2011-01-15 NOTE — Telephone Encounter (Signed)
PT NEEDS RX CALLED IN FOR LISINOPRIL 20-12.5 MG 2 A DAY. LAST RX WAS ONLY CALLED IN FOR 1 A DAY BY DR MAGOO. OUR RECORDS STATE IT SHOULD BE 2 A DAY.  HIS INSURANCE WILL NOT COVER DRUG UNTIL 01/19/11. NEEDS 12 PILLS CALLED IN.  COULD WE CALL HIM IN ENOUGH TO GET HIM THROUGH TILL THEN HE SAYED HE WOULD NOT MIND PAYING OUT OF POCKET FOR THEM.  THEN CALL A CORRECT RX IN FOR HIM FOR HIS NEXT REFILLS/TMJ

## 2011-05-29 ENCOUNTER — Other Ambulatory Visit: Payer: Self-pay | Admitting: Adult Health

## 2011-08-26 ENCOUNTER — Inpatient Hospital Stay (HOSPITAL_COMMUNITY)
Admission: EM | Admit: 2011-08-26 | Discharge: 2011-08-28 | DRG: 310 | Disposition: A | Discharge status: Home / Self Care | Payer: Medicare Other | Attending: Internal Medicine | Admitting: Internal Medicine

## 2011-08-26 ENCOUNTER — Emergency Department (HOSPITAL_COMMUNITY): Payer: Medicare Other

## 2011-08-26 ENCOUNTER — Encounter (HOSPITAL_COMMUNITY): Payer: Self-pay | Admitting: *Deleted

## 2011-08-26 DIAGNOSIS — E860 Dehydration: Secondary | ICD-10-CM

## 2011-08-26 DIAGNOSIS — I10 Essential (primary) hypertension: Secondary | ICD-10-CM | POA: Diagnosis present

## 2011-08-26 DIAGNOSIS — I4891 Unspecified atrial fibrillation: Secondary | ICD-10-CM | POA: Diagnosis present

## 2011-08-26 DIAGNOSIS — E785 Hyperlipidemia, unspecified: Secondary | ICD-10-CM | POA: Diagnosis present

## 2011-08-26 DIAGNOSIS — I4892 Unspecified atrial flutter: Secondary | ICD-10-CM

## 2011-08-26 DIAGNOSIS — I2510 Atherosclerotic heart disease of native coronary artery without angina pectoris: Secondary | ICD-10-CM | POA: Diagnosis present

## 2011-08-26 DIAGNOSIS — R55 Syncope and collapse: Secondary | ICD-10-CM

## 2011-08-26 DIAGNOSIS — N400 Enlarged prostate without lower urinary tract symptoms: Secondary | ICD-10-CM | POA: Diagnosis present

## 2011-08-26 DIAGNOSIS — Z951 Presence of aortocoronary bypass graft: Secondary | ICD-10-CM

## 2011-08-26 DIAGNOSIS — Z87898 Personal history of other specified conditions: Secondary | ICD-10-CM | POA: Diagnosis present

## 2011-08-26 DIAGNOSIS — Z87891 Personal history of nicotine dependence: Secondary | ICD-10-CM

## 2011-08-26 DIAGNOSIS — M1990 Unspecified osteoarthritis, unspecified site: Secondary | ICD-10-CM | POA: Diagnosis present

## 2011-08-26 HISTORY — DX: Unspecified atrial flutter: I48.92

## 2011-08-26 LAB — URINALYSIS, ROUTINE W REFLEX MICROSCOPIC
Bilirubin Urine: NEGATIVE
Glucose, UA: NEGATIVE mg/dL
Hgb urine dipstick: NEGATIVE
Ketones, ur: NEGATIVE mg/dL
Leukocytes, UA: NEGATIVE
Nitrite: NEGATIVE
Protein, ur: NEGATIVE mg/dL
Specific Gravity, Urine: 1.01 (ref 1.005–1.030)
Urobilinogen, UA: 0.2 mg/dL (ref 0.0–1.0)
pH: 6.5 (ref 5.0–8.0)

## 2011-08-26 LAB — COMPREHENSIVE METABOLIC PANEL WITH GFR
ALT: 13 U/L (ref 0–53)
AST: 17 U/L (ref 0–37)
Albumin: 3.5 g/dL (ref 3.5–5.2)
Alkaline Phosphatase: 66 U/L (ref 39–117)
BUN: 43 mg/dL — ABNORMAL HIGH (ref 6–23)
CO2: 29 meq/L (ref 19–32)
Calcium: 9.4 mg/dL (ref 8.4–10.5)
Chloride: 102 meq/L (ref 96–112)
Creatinine, Ser: 1.47 mg/dL — ABNORMAL HIGH (ref 0.50–1.35)
GFR calc Af Amer: 48 mL/min — ABNORMAL LOW (ref >90)
GFR calc non Af Amer: 41 mL/min — ABNORMAL LOW (ref >90)
Glucose, Bld: 153 mg/dL — ABNORMAL HIGH (ref 70–99)
Potassium: 3.8 meq/L (ref 3.5–5.1)
Sodium: 140 meq/L (ref 135–145)
Total Bilirubin: 0.5 mg/dL (ref 0.3–1.2)
Total Protein: 6.3 g/dL (ref 6.0–8.3)

## 2011-08-26 LAB — CBC
MCHC: 33.6 g/dL (ref 30.0–36.0)
Platelets: 119 10*3/uL — ABNORMAL LOW (ref 150–400)
RDW: 12.4 % (ref 11.5–15.5)

## 2011-08-26 LAB — DIFFERENTIAL
Basophils Absolute: 0 10*3/uL (ref 0.0–0.1)
Basophils Relative: 0 % (ref 0–1)
Monocytes Absolute: 0.2 10*3/uL (ref 0.1–1.0)
Neutro Abs: 9.3 10*3/uL — ABNORMAL HIGH (ref 1.7–7.7)
Neutrophils Relative %: 95 % — ABNORMAL HIGH (ref 43–77)

## 2011-08-26 LAB — CARDIAC PANEL(CRET KIN+CKTOT+MB+TROPI)
CK, MB: 3.4 ng/mL (ref 0.3–4.0)
Troponin I: <0.3 ng/mL (ref <0.30)

## 2011-08-26 MED ORDER — ACETAMINOPHEN 325 MG PO TABS
650.0000 mg | ORAL_TABLET | ORAL | Status: DC | PRN
  Administered 2011-08-27: 650 mg via ORAL
  Filled 2011-08-26: qty 2

## 2011-08-26 MED ORDER — DILTIAZEM HCL 100 MG IV SOLR
5.0000 mg/h | INTRAVENOUS | Status: DC
  Administered 2011-08-26 – 2011-08-27 (×2): 5 mg/h via INTRAVENOUS
  Filled 2011-08-26: qty 100

## 2011-08-26 MED ORDER — BRIMONIDINE TARTRATE-TIMOLOL 0.2-0.5 % OP SOLN: 1.0000 [drp] | Freq: Two times a day (BID) | OPHTHALMIC | Status: DC

## 2011-08-26 MED ORDER — ENOXAPARIN SODIUM 40 MG/0.4ML ~~LOC~~ SOLN
1.0000 mg/kg | Freq: Two times a day (BID) | SUBCUTANEOUS | Status: DC
  Administered 2011-08-27: 70 mg via SUBCUTANEOUS
  Filled 2011-08-26: qty 0.3

## 2011-08-26 MED ORDER — SIMVASTATIN 20 MG PO TABS
20.0000 mg | ORAL_TABLET | Freq: Every day | ORAL | Status: DC
  Administered 2011-08-27 (×2): 20 mg via ORAL
  Filled 2011-08-26 (×2): qty 1

## 2011-08-26 MED ORDER — ASPIRIN EC 81 MG PO TBEC
81.0000 mg | DELAYED_RELEASE_TABLET | Freq: Every day | ORAL | Status: DC
Start: 2011-08-27 — End: 2011-08-28
  Administered 2011-08-27 – 2011-08-28 (×2): 81 mg via ORAL
  Filled 2011-08-26 (×5): qty 1

## 2011-08-26 MED ORDER — TRAZODONE HCL 50 MG PO TABS
25.0000 mg | ORAL_TABLET | Freq: Every evening | ORAL | Status: DC | PRN
  Administered 2011-08-28: 25 mg via ORAL
  Filled 2011-08-26: qty 1

## 2011-08-26 MED ORDER — OXYCODONE HCL 5 MG PO TABS: 5.0000 mg | ORAL_TABLET | ORAL | Status: DC | PRN

## 2011-08-26 MED ORDER — METOPROLOL TARTRATE 50 MG PO TABS
50.0000 mg | ORAL_TABLET | Freq: Two times a day (BID) | ORAL | Status: DC
  Administered 2011-08-27 – 2011-08-28 (×4): 50 mg via ORAL
  Filled 2011-08-26 (×4): qty 1

## 2011-08-26 MED ORDER — FLEET ENEMA 7-19 GM/118ML RE ENEM: 1.0000 | ENEMA | Freq: Once | RECTAL | Status: AC | PRN

## 2011-08-26 MED ORDER — POTASSIUM CHLORIDE IN NACL 20-0.9 MEQ/L-% IV SOLN
INTRAVENOUS | Status: DC
  Administered 2011-08-26: 1000 mL via INTRAVENOUS

## 2011-08-26 MED ORDER — BISACODYL 10 MG RE SUPP: 10.0000 mg | Freq: Every day | RECTAL | Status: DC | PRN

## 2011-08-26 MED ORDER — ACETAMINOPHEN 650 MG RE SUPP: 650.0000 mg | Freq: Four times a day (QID) | RECTAL | Status: DC | PRN

## 2011-08-26 MED ORDER — ONDANSETRON HCL 4 MG/2ML IJ SOLN: 4.0000 mg | INTRAMUSCULAR | Status: DC | PRN

## 2011-08-26 MED ORDER — SODIUM CHLORIDE 0.9 % IV SOLN
Freq: Once | INTRAVENOUS | Status: AC
  Administered 2011-08-26: 500 mL via INTRAVENOUS

## 2011-08-26 MED ORDER — DILTIAZEM HCL 25 MG/5ML IV SOLN
5.0000 mg | Freq: Once | INTRAVENOUS | Status: AC
  Administered 2011-08-26: 5 mg via INTRAVENOUS
  Filled 2011-08-26: qty 5

## 2011-08-26 NOTE — H&P (Signed)
PCP:   Kirk Ruths, MD, MD   Chief Complaint:  Dizziness and sweating this afternoon  HPI: [name] is an 78 y.o. male.   Coronary artery disease status post CABG, paroxysmal atrial fibrillation, arthritis on Vicodin, took a laxative for constipation today,  began to have and had an episode of dizziness and diaphoresis before having a large loose stool at home and to further loose stool here in the hospital.  She was brought to the hospital because of the dizziness and sweating and was found to be persistent a rhythm suggestive of atrial flutter; he has not responded to a bolus of Cardizem and the hospitalist service has been called to assist with management.  Patient denies chest pain or palpitations or shortness of breath. Denies recent leg swelling. Denies black or bloody stool.  Patient appears to be somewhat hard of hearing and is also very guarded during the interview, which makes the history difficult. Most information was obtained from his daughter and wife who are present.  He denies skipping any of his medications.  Rewiew of Systems:  The patient denies anorexia, fever, weight loss,, vision loss, decreased hearing, (although he clearly has difficulty hearing)  hoarseness, chest pain, syncope, dyspnea on exertion, peripheral edema, balance deficits, hemoptysis, abdominal pain, melena, hematochezia, severe indigestion/heartburn, hematuria, incontinence, genital sores, muscle weakness, suspicious skin lesions, transient blindness, difficulty walking, depression, unusual weight change, abnormal bleeding, enlarged lymph nodes, angioedema, and breast masses. hi   Past Medical History  Diagnosis Date  . ASCVD (arteriosclerotic cardiovascular disease)     CABG 03/2001, stress nuclear in 2006-mild inferior ischemia; normal EF; Echocardiogram in 08/2006-normal LV/EF, no significant valvular abnormalities  . Hyperlipidemia   . DJD (degenerative joint disease)     Neck, shoulder  and knees  . BPH (benign prostatic hypertrophy)   . Cerebrovascular disease 01/2005    Left carotid bruit; plaque without focal stenosis in 01/2005; 2012-moderate plaque; increased velocity in the external carotids; no significant focal internal carotid artery stenosis; plaque ulceration in the right carotid bulb  . PAF (paroxysmal atrial fibrillation)     postoperative  . Hypertension     unspecified  . Tobacco abuse, in remission     discontinued in 1990    Past Surgical History  Procedure Date  . Cataract extraction w/ intraocular lens implant     Right   . Coronary artery bypass graft 2003  . Shoulder hemi-arthroplasty     Right Shoulder -   Dr.Harrison    Medications:  HOME MEDS: Prior to Admission medications   Medication Sig Start Date End Date Taking? Authorizing Provider  amLODipine (NORVASC) 5 MG tablet Take 1 tablet (5 mg total) by mouth daily. 12/08/10 12/08/11 Yes Kathlen Brunswick, MD  aspirin 81 MG tablet Take 81 mg by mouth daily.     Yes Historical Provider, MD  brimonidine-timolol (COMBIGAN) 0.2-0.5 % ophthalmic solution Place 1 drop into the right eye every 12 (twelve) hours.   Yes Historical Provider, MD  docusate sodium (STOOL SOFTENER) 100 MG capsule Take 100 mg by mouth as needed. For constipation   Yes Historical Provider, MD  furosemide (LASIX) 20 MG tablet Take 20 mg by mouth every other day. TAKE ONE TABLET EVERY OTHER DAY AS NEEDED FOR FLUID RETENTION   Yes Historical Provider, MD  HYDROcodone-acetaminophen (VICODIN) 5-500 MG per tablet Take 1 tablet by mouth every 4 (four) hours as needed. TAKE ONE TABLET BY MOUTH EVERY 4 TO 6 HOURS AS NEEDED FOR  PAIN   Yes Historical Provider, MD  lisinopril-hydrochlorothiazide (PRINZIDE,ZESTORETIC) 20-12.5 MG per tablet Take 2 tablets by mouth daily. 01/19/11  Yes Kathlen Brunswick, MD  meloxicam (MOBIC) 15 MG tablet Take 15 mg by mouth daily. DO NOT TAKE RELAFEN (NAMEBUMETONE) 500MG  WHILE TAKING THIS MEDICATION (per  pharmacy record instructions)   Yes Historical Provider, MD  metoprolol (LOPRESSOR) 50 MG tablet TAKE 1 TABLET BY MOUTH TWICE DAILY 05/29/11  Yes Jodelle Gross, NP  simvastatin (ZOCOR) 20 MG tablet  12/09/10  Yes Jodelle Gross, NP  nabumetone (RELAFEN) 500 MG tablet Take 500 mg by mouth 2 (two) times daily with a meal. DO NOT TAKE MOBIC (MELOXICAM) 15MG  WHILE TAKING THIS MEDICATION (per pharmacy record)    Historical Provider, MD     Allergies:  No Known Allergies  Social History:   reports that he has quit smoking. He does not have any smokeless tobacco history on file. He reports that he does not drink alcohol or use illicit drugs.  Family History: Family History  Problem Relation Age of Onset  . Sudden death Father 49     Physical Exam: Filed Vitals:   08/26/11 2205 08/26/11 2230 08/26/11 2330 08/26/11 2359  BP: 125/69     Pulse: 138 40    Temp:   98.2 F (36.8 C)   TempSrc:   Oral   Resp: 13 13 20    Height:   5\' 11"  (1.803 m)   Weight:   66.4 kg (146 lb 6.2 oz)   SpO2: 100% 94%  99%   Blood pressure 125/69, pulse 139, temperature 98.2 F (36.8 C), temperature source Oral, resp. rate 20, height 5\' 11"  (1.803 m), weight 66.4 kg (146 lb 6.2 oz), SpO2 99.00%.  GEN:  Pleasant elderly African American gentleman lying in the stretcher in no acute distress; guarded with all his responses;cooperative with exam PSYCH:  alert and oriented x4;  HEENT: Mucous membranes pink and anicteric; PERRLA; EOM intact; no cervical lymphadenopathy nor thyromegaly or carotid bruit; no JVD; Breasts:: Not examined CHEST WALL: No tenderness CHEST: Normal respiration, clear to auscultation bilaterally HEART: Regular tachycardia; no murmurs rubs or gallops BACK: No kyphosis or scoliosis; no CVA tenderness ABDOMEN: , soft non-tender; no masses, no organomegaly, normal abdominal bowel sounds;  Rectal Exam: Not done EXTREMITIES:  age-appropriate arthropathy of the hands and knees; trace edema  right leg; no ulcerations. Genitalia: not examined PULSES: 2+ and symmetric SKIN: Normal hydration no rash or ulceration CNS: Cranial nerves 2-12 grossly intact no focal lateralizing neurologic deficit   Labs & Imaging Results for orders placed during the hospital encounter of 08/26/11 (from the past 48 hour(s))  CBC     Status: Abnormal   Collection Time   08/26/11  8:19 PM      Component Value Range Comment   WBC 9.9  4.0 - 10.5 (K/uL)    RBC 3.65 (*) 4.22 - 5.81 (MIL/uL)    Hemoglobin 11.7 (*) 13.0 - 17.0 (g/dL)    HCT 69.6 (*) 29.5 - 52.0 (%)    MCV 95.3  78.0 - 100.0 (fL)    MCH 32.1  26.0 - 34.0 (pg)    MCHC 33.6  30.0 - 36.0 (g/dL)    RDW 28.4  13.2 - 44.0 (%)    Platelets 119 (*) 150 - 400 (K/uL)   DIFFERENTIAL     Status: Abnormal   Collection Time   08/26/11  8:19 PM      Component Value Range Comment  Neutrophils Relative 95 (*) 43 - 77 (%)    Neutro Abs 9.3 (*) 1.7 - 7.7 (K/uL)    Lymphocytes Relative 3 (*) 12 - 46 (%)    Lymphs Abs 0.3 (*) 0.7 - 4.0 (K/uL)    Monocytes Relative 2 (*) 3 - 12 (%)    Monocytes Absolute 0.2  0.1 - 1.0 (K/uL)    Eosinophils Relative 0  0 - 5 (%)    Eosinophils Absolute 0.0  0.0 - 0.7 (K/uL)    Basophils Relative 0  0 - 1 (%)    Basophils Absolute 0.0  0.0 - 0.1 (K/uL)   COMPREHENSIVE METABOLIC PANEL     Status: Abnormal   Collection Time   08/26/11  8:19 PM      Component Value Range Comment   Sodium 140  135 - 145 (mEq/L)    Potassium 3.8  3.5 - 5.1 (mEq/L)    Chloride 102  96 - 112 (mEq/L)    CO2 29  19 - 32 (mEq/L)    Glucose, Bld 153 (*) 70 - 99 (mg/dL)    BUN 43 (*) 6 - 23 (mg/dL)    Creatinine, Ser 1.61 (*) 0.50 - 1.35 (mg/dL)    Calcium 9.4  8.4 - 10.5 (mg/dL)    Total Protein 6.3  6.0 - 8.3 (g/dL)    Albumin 3.5  3.5 - 5.2 (g/dL)    AST 17  0 - 37 (U/L)    ALT 13  0 - 53 (U/L)    Alkaline Phosphatase 66  39 - 117 (U/L)    Total Bilirubin 0.5  0.3 - 1.2 (mg/dL)    GFR calc non Af Amer 41 (*) >90 (mL/min)    GFR calc  Af Amer 48 (*) >90 (mL/min)   CARDIAC PANEL(CRET KIN+CKTOT+MB+TROPI)     Status: Normal   Collection Time   08/26/11  8:19 PM      Component Value Range Comment   Total CK 94  7 - 232 (U/L)    CK, MB 3.4  0.3 - 4.0 (ng/mL)    Troponin I <0.30  <0.30 (ng/mL)    Relative Index RELATIVE INDEX IS INVALID  0.0 - 2.5    URINALYSIS, ROUTINE W REFLEX MICROSCOPIC     Status: Abnormal   Collection Time   08/26/11  9:21 PM      Component Value Range Comment   Color, Urine AMBER (*) YELLOW  BIOCHEMICALS MAY BE AFFECTED BY COLOR   APPearance CLEAR  CLEAR     Specific Gravity, Urine 1.010  1.005 - 1.030     pH 6.5  5.0 - 8.0     Glucose, UA NEGATIVE  NEGATIVE (mg/dL)    Hgb urine dipstick NEGATIVE  NEGATIVE     Bilirubin Urine NEGATIVE  NEGATIVE     Ketones, ur NEGATIVE  NEGATIVE (mg/dL)    Protein, ur NEGATIVE  NEGATIVE (mg/dL)    Urobilinogen, UA 0.2  0.0 - 1.0 (mg/dL)    Nitrite NEGATIVE  NEGATIVE     Leukocytes, UA NEGATIVE  NEGATIVE  MICROSCOPIC NOT DONE ON URINES WITH NEGATIVE PROTEIN, BLOOD, LEUKOCYTES, NITRITE, OR GLUCOSE <1000 mg/dL.   Dg Chest Port 1 View  08/26/2011  *RADIOLOGY REPORT*  Clinical Data: Generalized weakness.  Constipation.  Ex-smoker.  PORTABLE CHEST - 1 VIEW  Comparison: Previous examinations.  Findings: Normal sized heart.  Clear lungs.  The lungs remain hyperexpanded.  Stable post CABG changes and right humeral head prosthesis.  Left shoulder  degenerative changes with superior migration of the humeral head compatible with a chronic rotator cuff tear.  IMPRESSION:  1.  No acute abnormality. 2.  Stable changes of COPD. 3.  Left shoulder degenerative changes with evidence of a chronic rotator cuff tear.  Original Report Authenticated By: Darrol Angel, M.D.      Assessment Present on Admission:  .Syncope, near, related to dehydration and tachycardia  .Atrial flutter Mild dehydration as evidenced by elevated BUN/creatinine ratio  .ASCVD (arteriosclerotic cardiovascular  disease) .Hypertension .BENIGN PROSTATIC HYPERTROPHY, HX OF .DJD (degenerative joint disease)   PLAN: Admit this gentleman to the step down unit for control of his atrial flutter. We'll hydrate him to alleviate dehydration. There is a possibility that this is sudden A. flutter is related to a pulmonary embolus, will check his d-dimer, start full anti-coagulation, and when his renal function is being is improved in the morning will consider a CT angiogram.  Will continue his home dose of metoprolol  Other plans as per orders.  Rashia Mckesson 08/27/2011, 12:13 AM

## 2011-08-26 NOTE — ED Notes (Signed)
Paged Dr. Orvan Falconer for Admission

## 2011-08-26 NOTE — ED Notes (Signed)
Pt reports general weakness & constipation for 2 days. Took unknown stool softner, went & had bowel movement & became light headed.

## 2011-08-26 NOTE — ED Notes (Signed)
Attempted to call report. Was advised nurse receiving pt will call this nurse back for report. 

## 2011-08-26 NOTE — ED Notes (Signed)
Pt assisted to restroom.  

## 2011-08-26 NOTE — ED Notes (Signed)
Pulse rate of 40 not correct from data pull. EKG rate is correct.

## 2011-08-26 NOTE — ED Provider Notes (Signed)
History     CSN: [id number]  Arrival date & time 08/26/11  1951   First MD Initiated Contact with Patient 08/26/11 1953      Chief Complaint  Patient presents with  . Fatigue    (Consider location/radiation/quality/duration/timing/severity/associated sxs/prior treatment) HPI Comments: Patient has been constipated for the past couple of days.  Took a stool softener, went to bathroom and became near-syncopal, sweaty.  Family called 911 and he was transported here.  Patient is somewhat of a poor historian.  Patient is a 78 y.o. male presenting with weakness. The history is provided by the patient.  Weakness The primary symptoms include dizziness. The symptoms began 1 to 2 hours ago. The symptoms are improving. The neurological symptoms are diffuse.  Dizziness also occurs with weakness.  Additional symptoms include weakness.    Past Medical History  Diagnosis Date  . ASCVD (arteriosclerotic cardiovascular disease)     CABG 03/2001, stress nuclear in 2006-mild inferior ischemia; normal EF; Echocardiogram in 08/2006-normal LV/EF, no significant valvular abnormalities  . Hyperlipidemia   . DJD (degenerative joint disease)     Neck, shoulder and knees  . BPH (benign prostatic hypertrophy)   . Cerebrovascular disease 01/2005    Left carotid bruit; plaque without focal stenosis in 01/2005; 2012-moderate plaque; increased velocity in the external carotids; no significant focal internal carotid artery stenosis; plaque ulceration in the right carotid bulb  . PAF (paroxysmal atrial fibrillation)     postoperative  . Hypertension     unspecified  . Tobacco abuse, in remission     discontinued in 1990    Past Surgical History  Procedure Date  . Cataract extraction w/ intraocular lens implant     Right   . Coronary artery bypass graft 2003  . Shoulder hemi-arthroplasty     Right Shoulder -   Dr.Harrison    Family History  Problem Relation Age of Onset  . Sudden death Father 50     History  Substance Use Topics  . Smoking status: Former Games developer  . Smokeless tobacco: Not on file  . Alcohol Use: No      Review of Systems  Neurological: Positive for dizziness and weakness.  All other systems reviewed and are negative.    Allergies  Review of patient's allergies indicates no known allergies.  Home Medications   Current Outpatient Rx  Name Route Sig Dispense Refill  . AMLODIPINE BESYLATE 5 MG PO TABS Oral Take 1 tablet (5 mg total) by mouth daily. 30 tablet 11  . ASPIRIN 81 MG PO TABS Oral Take 81 mg by mouth daily.      Marland Kitchen LISINOPRIL-HYDROCHLOROTHIAZIDE 20-12.5 MG PO TABS Oral Take 2 tablets by mouth daily. 60 tablet 12  . METOPROLOL TARTRATE 50 MG PO TABS  TAKE 1 TABLET BY MOUTH TWICE DAILY 60 tablet 4  . NABUMETONE 500 MG PO TABS Oral Take 500 mg by mouth 2 (two) times daily.      Marland Kitchen SIMVASTATIN 20 MG PO TABS  TAKE 1 TABLET BY MOUTH EVERY DAY 30 tablet 6  . TERAZOSIN HCL 2 MG PO CAPS Oral Take 2 mg by mouth daily.       BP 101/59  Pulse 133  Temp(Src) 98.1 F (36.7 C) (Oral)  Resp 20  Ht 5\' 11"  (1.803 m)  Wt 145 lb (65.772 kg)  BMI 20.22 kg/m2  SpO2 97%  Physical Exam  Nursing note and vitals reviewed. Constitutional: He is oriented to person, place, and time. He appears well-developed  and well-nourished. No distress.  HENT:  Head: Normocephalic and atraumatic.  Neck: Normal range of motion. Neck supple.  Cardiovascular:  No murmur heard.      Tachycardic, but regular.  Pulmonary/Chest: Effort normal and breath sounds normal. No respiratory distress. He has no wheezes.  Abdominal: Soft. Bowel sounds are normal. He exhibits no distension. There is no tenderness. There is no rebound.  Musculoskeletal: Normal range of motion.  Neurological: He is alert and oriented to person, place, and time. No cranial nerve deficit. Coordination normal.  Skin: Skin is warm. He is not diaphoretic.    ED Course  Procedures (including critical care  time)  Labs Reviewed - No data to display No results found.   No diagnosis found.   Date: 08/26/2011  Rate: 132  Rhythm: atrial flutter  QRS Axis: left  Intervals: normal  ST/T Wave abnormalities: normal  Conduction Disutrbances:none  Narrative Interpretation:   Old EKG Reviewed: changes noted     MDM  The patient presents persistently tachycardic after a near-syncopal episode at home.  His ekg looks to me like atrial flutter.  He was given cardizem and will be placed on a cardizem drip.  He will be admitted to Dr. Orvan Falconer of [facility].        Geoffery Lyons, MD 08/26/11 2154

## 2011-08-27 DIAGNOSIS — R55 Syncope and collapse: Secondary | ICD-10-CM

## 2011-08-27 DIAGNOSIS — I10 Essential (primary) hypertension: Secondary | ICD-10-CM

## 2011-08-27 DIAGNOSIS — I4892 Unspecified atrial flutter: Principal | ICD-10-CM

## 2011-08-27 DIAGNOSIS — I7090 Unspecified atherosclerosis: Secondary | ICD-10-CM

## 2011-08-27 DIAGNOSIS — E860 Dehydration: Secondary | ICD-10-CM

## 2011-08-27 DIAGNOSIS — I2510 Atherosclerotic heart disease of native coronary artery without angina pectoris: Secondary | ICD-10-CM

## 2011-08-27 DIAGNOSIS — I509 Heart failure, unspecified: Secondary | ICD-10-CM

## 2011-08-27 LAB — CBC
Hemoglobin: 11.2 g/dL — ABNORMAL LOW (ref 13.0–17.0)
MCH: 31.6 pg (ref 26.0–34.0)
MCV: 95.2 fL (ref 78.0–100.0)
Platelets: 127 10*3/uL — ABNORMAL LOW (ref 150–400)
RBC: 3.54 MIL/uL — ABNORMAL LOW (ref 4.22–5.81)

## 2011-08-27 LAB — BASIC METABOLIC PANEL
CO2: 29 mEq/L (ref 19–32)
Calcium: 9.2 mg/dL (ref 8.4–10.5)
Glucose, Bld: 108 mg/dL — ABNORMAL HIGH (ref 70–99)
Sodium: 142 mEq/L (ref 135–145)

## 2011-08-27 LAB — CARDIAC PANEL(CRET KIN+CKTOT+MB+TROPI)
CK, MB: 3.5 ng/mL (ref 0.3–4.0)
Relative Index: INVALID (ref 0.0–2.5)
Total CK: 69 U/L (ref 7–232)
Total CK: 94 U/L (ref 7–232)
Troponin I: <0.3 ng/mL (ref <0.30)
Troponin I: <0.3 ng/mL (ref <0.30)

## 2011-08-27 LAB — PROTIME-INR: Prothrombin Time: 14.4 seconds (ref 11.6–15.2)

## 2011-08-27 MED ORDER — BRIMONIDINE TARTRATE 0.2 % OP SOLN
1.0000 [drp] | Freq: Two times a day (BID) | OPHTHALMIC | Status: DC
  Administered 2011-08-27 – 2011-08-28 (×3): 1 [drp] via OPHTHALMIC
  Filled 2011-08-27: qty 5

## 2011-08-27 MED ORDER — TIMOLOL MALEATE 0.5 % OP SOLN
1.0000 [drp] | Freq: Two times a day (BID) | OPHTHALMIC | Status: DC
  Administered 2011-08-27 – 2011-08-28 (×3): 1 [drp] via OPHTHALMIC
  Filled 2011-08-27: qty 5

## 2011-08-27 MED ORDER — DILTIAZEM HCL ER COATED BEADS 120 MG PO CP24
120.0000 mg | ORAL_CAPSULE | Freq: Every day | ORAL | Status: DC
  Administered 2011-08-28: 120 mg via ORAL
  Filled 2011-08-27: qty 1

## 2011-08-27 MED ORDER — AMLODIPINE BESYLATE 5 MG PO TABS: 5.0000 mg | ORAL_TABLET | Freq: Every day | ORAL | Status: DC

## 2011-08-27 MED ORDER — WARFARIN SODIUM 5 MG PO TABS
5.0000 mg | ORAL_TABLET | Freq: Once | ORAL | Status: AC
  Administered 2011-08-27: 5 mg via ORAL
  Filled 2011-08-27: qty 1

## 2011-08-27 MED ORDER — WARFARIN - PHARMACIST DOSING INPATIENT: Freq: Every day | Status: DC

## 2011-08-27 MED ORDER — PATIENT'S GUIDE TO USING COUMADIN BOOK
Freq: Once | Status: AC
  Administered 2011-08-27: 1
  Filled 2011-08-27: qty 1

## 2011-08-27 MED ORDER — WARFARIN VIDEO
Freq: Once | Status: AC
  Administered 2011-08-27: 1

## 2011-08-27 MED ORDER — ENOXAPARIN SODIUM 60 MG/0.6ML ~~LOC~~ SOLN
60.0000 mg | Freq: Two times a day (BID) | SUBCUTANEOUS | Status: DC
  Administered 2011-08-27 (×2): 60 mg via SUBCUTANEOUS
  Filled 2011-08-27 (×3): qty 0.6

## 2011-08-27 NOTE — Evaluation (Signed)
Physical Therapy Evaluation Patient Details Name: [name] MRN: [id number] DOB: 27-Jun-1923 Today's Date: 08/27/2011 Time: 4098-1191 PT Time Calculation (min): 35 min  PT Assessment / Plan / Recommendation Clinical Impression  Pt is very cooperative, wants to go home.  His mobility is limited at baseline because of DJD of R hip and knee, but gait is stable.   .HR remained steady at about 66 BPM and he experienced no dizziness, etc. with mobility.  No further PT is indicated.    PT Assessment  Patent does not need any further PT services    Follow Up Recommendations  No PT follow up    Barriers to Discharge        lEquipment Recommendations  None recommended by PT    Recommendations for Other Services     Frequency      Precautions / Restrictions Precautions Precautions: None Restrictions Weight Bearing Restrictions: No   Pertinent Vitals/Pain       Mobility  Bed Mobility Bed Mobility: Sit to Supine;Supine to Sit Supine to Sit: 7: Independent Sit to Supine: 7: Independent Sit to Sidelying Right: 7: Independent Transfers Transfers: Sit to Stand;Stand to Sit;Stand Pivot Transfers Sit to Stand: 6: Modified independent (Device/Increase time) Stand to Sit: 6: Modified independent (Device/Increase time) Stand Pivot Transfers: 6: Modified independent (Device/Increase time) Ambulation/Gait Ambulation/Gait Assistance: 6: Modified independent (Device/Increase time) Ambulation Distance (Feet): 100 Feet Assistive device: Small based quad cane Gait Pattern: Step-through pattern;Antalgic General Gait Details: pt externally rotates R hip due to DJD but gait is stable Stairs: No Wheelchair Mobility Wheelchair Mobility: No    Exercises     PT Diagnosis:    PT Problem List:   PT Treatment Interventions:     PT Goals    Visit Information  Last PT Received On: 08/27/11    Subjective Data  Subjective: no c/o Patient Stated Goal: return home   Prior Functioning  Home Living Lives With: Spouse Available Help at Discharge: Family Type of Home: House Home Access: Stairs to enter Secretary/administrator of Steps: 2 Entrance Stairs-Rails: Right Home Layout: One level Firefighter: Standard Home Adaptive Equipment: Quad cane Prior Function Level of Independence: Independent with assistive device(s) Able to Take Stairs?: Yes Driving: Yes Vocation: Retired Musician: No difficulties    Cognition  Overall Cognitive Status: Appears within functional limits for tasks assessed/performed Arousal/Alertness: Awake/alert Orientation Level: Appears intact for tasks assessed Behavior During Session: [facility] for tasks performed    Extremity/Trunk Assessment Right Upper Extremity Assessment RUE ROM/Strength/Tone: Within functional levels RUE Sensation: WFL - Light Touch;WFL - Proprioception RUE Coordination: WFL - gross motor Left Upper Extremity Assessment LUE ROM/Strength/Tone: Within functional levels LUE Sensation: WFL - Light Touch;WFL - Proprioception LUE Coordination: WFL - gross motor Right Lower Extremity Assessment RLE ROM/Strength/Tone: WFL for tasks assessed (Has DJD of hip and knee) RLE Sensation: WFL - Light Touch;WFL - Proprioception RLE Coordination: WFL - gross motor Left Lower Extremity Assessment LLE ROM/Strength/Tone: Within functional levels LLE Sensation: WFL - Light Touch;WFL - Proprioception LLE Coordination: WFL - gross motor Trunk Assessment Trunk Assessment: Normal   Balance Balance Balance Assessed: No (WNL by observaton)  End of Session PT - End of Session Equipment Utilized During Treatment: Gait belt Activity Tolerance: Patient tolerated treatment well Patient left: in chair;with call bell/phone within reach;with family/visitor present Nurse Communication: Mobility status   Konrad Penta 08/27/2011, 10:28 AM

## 2011-08-27 NOTE — Progress Notes (Signed)
ANTICOAGULATION CONSULT NOTE - Initial Consult  Pharmacy Consult for Coumadin Indication: atrial fibrillation  No Known Allergies  Patient Measurements: Height: 5\' 11"  (180.3 cm) Weight: 146 lb 6.2 oz (66.4 kg) IBW/kg (Calculated) : 75.3   Vital Signs: Temp: 98.1 F (36.7 C) (05/31 0400) Temp src: Oral (05/31 0400) BP: 102/52 mmHg (05/31 0645) Pulse Rate: 73  (05/31 0645)  Labs:  Basename 08/27/11 0700 08/27/11 0424 08/26/11 2351 08/26/11 2019  HGB -- 11.2* -- 11.7*  HCT -- 33.7* -- 34.8*  PLT -- 127* -- 119*  APTT -- -- 29 --  LABPROT -- -- 14.4 --  INR -- -- 1.10 --  HEPARINUNFRC -- -- -- --  CREATININE -- 1.23 -- 1.47*  CKTOTAL 69 -- 94 94  CKMB 3.2 -- 3.5 3.4  TROPONINI <0.30 -- <0.30 <0.30    Estimated Creatinine Clearance: 40.5 ml/min (by C-G formula based on Cr of 1.23).   Medical History: Past Medical History  Diagnosis Date  . ASCVD (arteriosclerotic cardiovascular disease)     CABG 03/2001, stress nuclear in 2006-mild inferior ischemia; normal EF; Echocardiogram in 08/2006-normal LV/EF, no significant valvular abnormalities  . Hyperlipidemia   . DJD (degenerative joint disease)     Neck, shoulder and knees  . BPH (benign prostatic hypertrophy)   . Cerebrovascular disease 01/2005    Left carotid bruit; plaque without focal stenosis in 01/2005; 2012-moderate plaque; increased velocity in the external carotids; no significant focal internal carotid artery stenosis; plaque ulceration in the right carotid bulb  . PAF (paroxysmal atrial fibrillation)     postoperative  . Hypertension     unspecified  . Tobacco abuse, in remission     discontinued in 1990    Medications:  Scheduled:    . sodium chloride   Intravenous Once  . amLODipine  5 mg Oral Daily  . aspirin EC  81 mg Oral Daily  . brimonidine  1 drop Right Eye Q12H   And  . timolol  1 drop Right Eye Q12H  . diltiazem  5 mg Intravenous Once  . enoxaparin  60 mg Subcutaneous Q12H  . metoprolol   50 mg Oral BID  . patient's guide to using coumadin book   Does not apply Once  . simvastatin  20 mg Oral q1800  . warfarin  5 mg Oral ONCE-1800  . warfarin   Does not apply Once  . Warfarin - Pharmacist Dosing Inpatient   Does not apply q1800  . DISCONTD: brimonidine-timolol  1 drop Right Eye Q12H  . DISCONTD: enoxaparin  1 mg/kg Subcutaneous Q12H    Assessment: 78 yo M with new onset Afib currently in NSR to start on Coumadin.  CHADS2=2.  No history of bleeding noted.  He is currently on full dose Lovenox for anticoagulation.   Goal of Therapy:  INR 2-3 Monitor platelets by anticoagulation protocol: Yes 4hr antiXa level 0.6-1.2   Plan:  1) Initiate warfarin 5mg  po x 1 dose today 2) Daily INR 3) Initiate warfarin education 4) Changed Lovenox to 60mg  sq q12h (rounded down to nearest 10mg  syringe given pt's age and renal function)  Elson Clan 08/27/2011,9:23 AM

## 2011-08-27 NOTE — Progress Notes (Signed)
UR chart review completed.  

## 2011-08-27 NOTE — Progress Notes (Signed)
Pt transferred to room 314 per MD order. Report called to RN. Pt transferred via wheelchair with personal belongings. Family members aware of transfer.

## 2011-08-27 NOTE — Consult Note (Signed)
CARDIOLOGY CONSULT NOTE  Patient ID: [name] MRN: [id number] DOB/AGE: 76-Jan-1925 76 y.o.  Admit date: 08/26/2011 Referring Physician: PTH Primary Emilee Hero, MD, MD Primary Cardiologist: Dietrich Pates Reason for Consultation: Atrial flutter Active Problems:  BENIGN PROSTATIC HYPERTROPHY, HX OF  ASCVD (arteriosclerotic cardiovascular disease)  DJD (degenerative joint disease)  Hypertension  Syncope, near  Atrial flutter  HPI: [name] is a pleasant 78 year old male patient of Dr. Dietrich Pates that we follow for known history of CAD, status post CABG, carotid artery disease, and hypertension. He also has a history of paroxysmal atrial fibrillation that has not been well documented, with one episode post operatively after CABG.. He was in his usual state of health when he was complaining of some constipation, took a stool softener for relief, and had a large bowel movement. Afterwards the patient began to be very diaphoretic and weak with complaints of dizziness. On arrival to the emergency room he was found to be in atrial flutter with a rate of 133 beats per minute. He was given a Cardizem bolus and started on a Cardizem drip. He was found to have an elevation in his creatinine of 1.23 and BUN of 35. It was felt that he was mildly dehydrated. He has been given IV fluid hydration. Blood pressure on arrival 101/59. Chest x-ray revealed changes of COPD but no CHF or pulmonary edema. No infective process was seen.     He does have a history of some medical noncompliance at times by forgetting to take his medications. On one office visit he and his wife were counseled on compliance and her need to assist him in his medication regimen daily. He is slightly confused and a poor historian, but pleasant and communicates well. He states he is feeling fine now. He denies any further complaints or diaphoresis or weakness; he denies any chest pain or shortness of breath. Home meds include metoprolol 50  mg twice a day, amlodipine 5 mg daily along with Lasix 20 mg daily and lisinopril HCTZ 20/12.5 mg twice a day. Most recent echo was completed 2008 with normal EF. He continues on Cardizem drip at 2.5 mg an hour. As I finished assessing patient, he converted to NSR, rate of 59 bpm.   Review of systems complete and found to be negative unless listed above  Past Medical History  Diagnosis Date  . ASCVD (arteriosclerotic cardiovascular disease)     CABG 03/2001, stress nuclear in 2006-mild inferior ischemia; normal EF; Echocardiogram in 08/2006-normal LV/EF, no significant valvular abnormalities  . Hyperlipidemia   . DJD (degenerative joint disease)     Neck, shoulder and knees  . BPH (benign prostatic hypertrophy)   . Cerebrovascular disease 01/2005    Left carotid bruit; plaque without focal stenosis in 01/2005; 2012-moderate plaque; increased velocity in the external carotids; no significant focal internal carotid artery stenosis; plaque ulceration in the right carotid bulb  . PAF (paroxysmal atrial fibrillation)     postoperative  . Hypertension     unspecified  . Tobacco abuse, in remission     discontinued in 1990    Family History  Problem Relation Age of Onset  . Sudden death Father 67    History   Social History  . Marital Status: Married    Spouse Name: N/A    Number of Children: N/A  . Years of Education: N/A   Occupational History  . Not on file.   Social History Main Topics  . Smoking status: Former Games developer  . Smokeless tobacco:  Not on file  . Alcohol Use: No  . Drug Use: No  . Sexually Active:    Other Topics Concern  . Not on file   Social History Narrative  . No narrative on file    Past Surgical History  Procedure Date  . Cataract extraction w/ intraocular lens implant     Right   . Coronary artery bypass graft 2003  . Shoulder hemi-arthroplasty     Right Shoulder -   Dr.Harrison     Prescriptions prior to admission  Medication Sig Dispense Refill    . amLODipine (NORVASC) 5 MG tablet Take 1 tablet (5 mg total) by mouth daily.  30 tablet  11  . aspirin 81 MG tablet Take 81 mg by mouth daily.        . brimonidine-timolol (COMBIGAN) 0.2-0.5 % ophthalmic solution Place 1 drop into the right eye every 12 (twelve) hours.      . docusate sodium (STOOL SOFTENER) 100 MG capsule Take 100 mg by mouth as needed. For constipation      . furosemide (LASIX) 20 MG tablet Take 20 mg by mouth every other day. TAKE ONE TABLET EVERY OTHER DAY AS NEEDED FOR FLUID RETENTION      . HYDROcodone-acetaminophen (VICODIN) 5-500 MG per tablet Take 1 tablet by mouth every 4 (four) hours as needed. TAKE ONE TABLET BY MOUTH EVERY 4 TO 6 HOURS AS NEEDED FOR PAIN      . lisinopril-hydrochlorothiazide (PRINZIDE,ZESTORETIC) 20-12.5 MG per tablet Take 2 tablets by mouth daily.  60 tablet  12  . meloxicam (MOBIC) 15 MG tablet Take 15 mg by mouth daily. DO NOT TAKE RELAFEN (NAMEBUMETONE) 500MG  WHILE TAKING THIS MEDICATION (per pharmacy record instructions)      . metoprolol (LOPRESSOR) 50 MG tablet TAKE 1 TABLET BY MOUTH TWICE DAILY  60 tablet  4  . simvastatin (ZOCOR) 20 MG tablet       . nabumetone (RELAFEN) 500 MG tablet Take 500 mg by mouth 2 (two) times daily with a meal. DO NOT TAKE MOBIC (MELOXICAM) 15MG  WHILE TAKING THIS MEDICATION (per pharmacy record)      Physical Exam: Blood pressure 102/52, pulse 73, temperature 98.1 F (36.7 C), temperature source Oral, resp. rate 12, height 5\' 11"  (1.803 m), weight 146 lb 6.2 oz (66.4 kg), SpO2 98.00%.  General: Well developed, well nourished, in no acute distress; thin Head: Eyes PERRLA, No xanthomas.   Normal cephalic and atramatic; bilateral arcus  Lungs: Clear bilaterally with only mild crackles in the left base; kyphosis Heart:  HRRR, without MRG.  No carotid bruit. No JVD.  No abdominal bruits. No femoral bruits. Abdomen: Bowel sounds are positive, abdomen soft and non-tender without masses Msk:  Back normal, normal gait.  Normal strength and tone for age.Right knee painful to ROM.  Extremities: No clubbing, cyanosis or edema.  DP +1 Neuro: Alert and oriented X 3. Poor historian. Psych:  Good affect, responds appropriately   Lab Results  Component Value Date   WBC 8.7 08/27/2011   HGB 11.2* 08/27/2011   HCT 33.7* 08/27/2011   MCV 95.2 08/27/2011   PLT 127* 08/27/2011    Lab 08/27/11 0424 08/26/11 2019  NA 142 --  K 3.9 --  CL 106 --  CO2 29 --  BUN 35* --  CREATININE 1.23 --  CALCIUM 9.2 --  PROT -- 6.3  BILITOT -- 0.5  ALKPHOS -- 66  ALT -- 13  AST -- 17  GLUCOSE 108* --  Radiology: 08/26/2011.  Dg Chest Port 1 View.  Findings: Normal sized heart.  Clear lungs.  The lungs remain hyperexpanded.  Stable post CABG changes and right humeral head prosthesis.  Left shoulder degenerative changes with superior migration of the humeral head compatible with a chronic rotator cuff tear.   QIO:NGEXBM flutter rate of 74 bpm with variable block early am of 08/27/2011 Current EKG after conversion: NSR rate of 52 bpm, T-Wave flattening in lead III, Left axis deviation.  ASSESSMENT AND PLAN:   1. Paroxysmal Atrial flutter: Now converted to normal sinus rhythm on Cardizem drip, with continuation of metoprolol. Heart rate is now 59 beats per minute with a blood pressure 108/69. We will discontinue Cardizem drip, and continue metoprolol as he was taking prior to admission.  CHADS score 2.(age and hypertension). CHAD's Vasc Score would add PAD with known history of carotid artery disease. Continue ASA for now.  2. Hypertension: Throughout admission he has been hypotensive. He is getting IV fluid rehydration. Creatinine is mildly elevated. We will discontinue Lasix. He is taking it when necessary for fluid retention, with admission evidence of dehydration I would like to eliminate this from his medication regimen. And possibly also removed HCTZ portion of antihypertensives if necessary. He is currently on lisinopril 20  mg/12.5 twice a day at home. Neither Lasix nor lisinopril have been prescribed during hospitalization. This is due to hypotension.  Would continue amlodipine and metoprolol, only on discharge if blood pressure can accommodate this.  3. CAD: He has not had any ischemic testing since 2006. Troponins were found to be negative. Doubt ACS causing AF.  Echo is pending.  Bettey Mare. Lyman Bishop NP [facility] 08/27/2011, 8:36 AM  Cardiology Attending Patient interviewed and examined. Discussed with Joni Reining, NP.  Above note annotated and modified based upon my findings.  The patient has converted from atrial flutter, which likely was the cause for his presenting symptoms.  There may have been connection to his episode of constipation, but this is not entirely clear.  He suffered a recent episode of atrial flutter in March based upon review of previous EKGs.  Recurrent arrhythmias likely.  We need to maintain a medical regime that might control his ventricular rate.  Amlodipine will be discontinued and diltiazem substituted, initially at a dose of 120 mg per day.  Dose can be titrated upwards as necessary.    Patient resides at home with an elderly and somewhat debilitated spouse.  He does not appear to be a ideal candidate for chronic anticoagulation.  Cache Bing, MD 08/27/2011, 5:00 PM

## 2011-08-27 NOTE — Progress Notes (Signed)
*  PRELIMINARY RESULTS* Echocardiogram 2D Echocardiogram has been performed.  [name] 08/27/2011, 2:45 PM

## 2011-08-27 NOTE — Progress Notes (Signed)
Subjective: Patient denies any chest pain, shortness of breath, palpitations.  Telemetry reveals that patient just converted into sinus rhythm  Objective: Vital signs in last 24 hours: Temp:  [98.1 F (36.7 C)-98.2 F (36.8 C)] 98.1 F (36.7 C) (05/31 0400) Pulse Rate:  [39-138] 73  (05/31 0645) Resp:  [9-22] 12  (05/31 0645) BP: (94-125)/(42-82) 102/52 mmHg (05/31 0645) SpO2:  [94 %-100 %] 98 % (05/31 0645) Weight:  [65.772 kg (145 lb)-66.4 kg (146 lb 6.2 oz)] 66.4 kg (146 lb 6.2 oz) (05/30 2330) Weight change:  Last BM Date: 08/26/11  Intake/Output from previous day: 05/30 0701 - 05/31 0700 In: 1261.3 [P.O.:100; I.V.:1161.3] Out: 225 [Urine:225]     Physical Exam: General: Alert, awake, oriented x3, in no acute distress. HEENT: No bruits, no goiter. Heart: Regular rate and rhythm, without murmurs, rubs, gallops. Lungs: crackles at right base Abdomen: Soft, nontender, nondistended, positive bowel sounds. Extremities: No clubbing cyanosis or edema with positive pedal pulses. Neuro: Grossly intact, nonfocal.    Lab Results: Basic Metabolic Panel:  Basename 08/27/11 0424 08/26/11 2351 08/26/11 2019  NA 142 -- 140  K 3.9 -- 3.8  CL 106 -- 102  CO2 29 -- 29  GLUCOSE 108* -- 153*  BUN 35* -- 43*  CREATININE 1.23 -- 1.47*  CALCIUM 9.2 -- 9.4  MG -- 2.2 --  PHOS -- -- --   Liver Function Tests:  Basename 08/26/11 2019  AST 17  ALT 13  ALKPHOS 66  BILITOT 0.5  PROT 6.3  ALBUMIN 3.5   No results found for this basename: LIPASE:2,AMYLASE:2 in the last 72 hours No results found for this basename: AMMONIA:2 in the last 72 hours CBC:  Basename 08/27/11 0424 08/26/11 2019  WBC 8.7 9.9  NEUTROABS -- 9.3*  HGB 11.2* 11.7*  HCT 33.7* 34.8*  MCV 95.2 95.3  PLT 127* 119*   Cardiac Enzymes:  Basename 08/27/11 0700 08/26/11 2351 08/26/11 2019  CKTOTAL 69 94 94  CKMB 3.2 3.5 3.4  CKMBINDEX -- -- --  TROPONINI <0.30 <0.30 <0.30   BNP: No results found for  this basename: PROBNP:3 in the last 72 hours D-Dimer: No results found for this basename: DDIMER:2 in the last 72 hours CBG: No results found for this basename: GLUCAP:6 in the last 72 hours Hemoglobin A1C: No results found for this basename: HGBA1C in the last 72 hours Fasting Lipid Panel: No results found for this basename: CHOL,HDL,LDLCALC,TRIG,CHOLHDL,LDLDIRECT in the last 72 hours Thyroid Function Tests: No results found for this basename: TSH,T4TOTAL,FREET4,T3FREE,THYROIDAB in the last 72 hours Anemia Panel: No results found for this basename: VITAMINB12,FOLATE,FERRITIN,TIBC,IRON,RETICCTPCT in the last 72 hours Coagulation:  Basename 08/26/11 2351  LABPROT 14.4  INR 1.10   Urine Drug Screen: Drugs of Abuse  No results found for this basename: labopia, cocainscrnur, labbenz, amphetmu, thcu, labbarb    Alcohol Level: No results found for this basename: ETH:2 in the last 72 hours Urinalysis:  Basename 08/26/11 2121  COLORURINE AMBER*  LABSPEC 1.010  PHURINE 6.5  GLUCOSEU NEGATIVE  HGBUR NEGATIVE  BILIRUBINUR NEGATIVE  KETONESUR NEGATIVE  PROTEINUR NEGATIVE  UROBILINOGEN 0.2  NITRITE NEGATIVE  LEUKOCYTESUR NEGATIVE   Recent Results (from the past 240 hour(s))  MRSA PCR SCREENING     Status: Normal   Collection Time   08/26/11 11:36 PM      Component Value Range Status Comment   MRSA by PCR NEGATIVE  NEGATIVE  Final     Studies/Results: Dg Chest Port 1 View  08/26/2011  *  RADIOLOGY REPORT*  Clinical Data: Generalized weakness.  Constipation.  Ex-smoker.  PORTABLE CHEST - 1 VIEW  Comparison: Previous examinations.  Findings: Normal sized heart.  Clear lungs.  The lungs remain hyperexpanded.  Stable post CABG changes and right humeral head prosthesis.  Left shoulder degenerative changes with superior migration of the humeral head compatible with a chronic rotator cuff tear.  IMPRESSION:  1.  No acute abnormality. 2.  Stable changes of COPD. 3.  Left shoulder  degenerative changes with evidence of a chronic rotator cuff tear.  Original Report Authenticated By: Darrol Angel, M.D.    Medications: Scheduled Meds:   . sodium chloride   Intravenous Once  . aspirin EC  81 mg Oral Daily  . brimonidine  1 drop Right Eye Q12H   And  . timolol  1 drop Right Eye Q12H  . diltiazem  5 mg Intravenous Once  . enoxaparin  1 mg/kg Subcutaneous Q12H  . metoprolol  50 mg Oral BID  . simvastatin  20 mg Oral q1800  . DISCONTD: brimonidine-timolol  1 drop Right Eye Q12H   Continuous Infusions:   . 0.9 % NaCl with KCl 20 mEq / L 100 mL/hr at 08/27/11 0600  . diltiazem (CARDIZEM) infusion 5 mg/hr (08/27/11 0839)   PRN Meds:.acetaminophen, acetaminophen, bisacodyl, ondansetron (ZOFRAN) IV, oxyCODONE, sodium phosphate, traZODone  Assessment/Plan:  Active Problems:  BENIGN PROSTATIC HYPERTROPHY, HX OF  ASCVD (arteriosclerotic cardiovascular disease)  DJD (degenerative joint disease)  Hypertension  Syncope, near  Atrial flutter  Plan:  1. Atrial flutter, now converted into sinus rhythm.  Will discontinue cardizem infusion.  Continue with oral lopressor dosing.  He is on full dose lovenox, will add warfarin.  His arrythmia may have been precipitated by volume depletion.  He does have a history of paroxysmal a fib in his history. Cardiac enzymes are negative. Cardiology consultation has been requested.  2. HTN, stable  3. Near syncope, likely due to #1, resolved, will get PT to see  4. Dehydration, improved with IV fluids, now taking po, will discontinue IV fluids.   5. Dispo, will transfer to telemetry, possibly home in am if continues to improve.   LOS: 1 day   Myeesha Shane [facility] Pager: [phone] 08/27/2011, 9:01 AM

## 2011-08-27 NOTE — Plan of Care (Signed)
Problem: Consults Goal: Atrial Arhythmia Patient Education (See Patient Education module for education specifics.)  Outcome: Completed/Met Date Met:  08/27/11 Pt education completed  Problem: Phase I Progression Outcomes Goal: Ventricular heart rate < 120/min Outcome: Completed/Met Date Met:  08/27/11 On Cardizem drip

## 2011-08-27 NOTE — Care Management Note (Signed)
    Page 1 of 1   08/27/2011     11:08:04 AM   CARE MANAGEMENT NOTE 08/27/2011  Patient:  [name], [name]   Account Number:  [id number]  Date Initiated:  08/27/2011  Documentation initiated by:  Sharrie Rothman  Subjective/Objective Assessment:   Pt admitted from home with afib with RVR. Pt lives with wife and will return home at discharge. Pt does use a cane for assistance with ambulation.     Action/Plan:   No CM needs noted at this time.   Anticipated DC Date:  09/02/2011   Anticipated DC Plan:  HOME/SELF CARE      DC Planning Services  CM consult      Choice offered to / List presented to:             Status of service:  Completed, signed off Medicare Important Message given?   (If response is "NO", the following Medicare IM given date fields will be blank) Date Medicare IM given:   Date Additional Medicare IM given:    Discharge Disposition:    Per UR Regulation:    If discussed at Long Length of Stay Meetings, dates discussed:    Comments:  08/27/11 1107 Arlyss Queen, RN BSN CM

## 2011-08-28 ENCOUNTER — Encounter (HOSPITAL_COMMUNITY): Payer: Self-pay | Admitting: Internal Medicine

## 2011-08-28 DIAGNOSIS — I10 Essential (primary) hypertension: Secondary | ICD-10-CM

## 2011-08-28 DIAGNOSIS — E860 Dehydration: Secondary | ICD-10-CM

## 2011-08-28 DIAGNOSIS — R55 Syncope and collapse: Secondary | ICD-10-CM

## 2011-08-28 DIAGNOSIS — I4892 Unspecified atrial flutter: Secondary | ICD-10-CM

## 2011-08-28 LAB — CBC
HCT: 33.2 % — ABNORMAL LOW (ref 39.0–52.0)
Hemoglobin: 11 g/dL — ABNORMAL LOW (ref 13.0–17.0)
RBC: 3.51 MIL/uL — ABNORMAL LOW (ref 4.22–5.81)
WBC: 5.7 10*3/uL (ref 4.0–10.5)

## 2011-08-28 LAB — BASIC METABOLIC PANEL
Chloride: 106 mEq/L (ref 96–112)
GFR calc Af Amer: 72 mL/min — ABNORMAL LOW (ref >90)
Potassium: 3.8 mEq/L (ref 3.5–5.1)

## 2011-08-28 LAB — PROTIME-INR: INR: 1.04 (ref 0.00–1.49)

## 2011-08-28 MED ORDER — WARFARIN SODIUM 5 MG PO TABS: 5.0000 mg | ORAL_TABLET | Freq: Once | ORAL | Status: DC

## 2011-08-28 MED ORDER — DILTIAZEM HCL ER COATED BEADS 120 MG PO CP24: 120.0000 mg | ORAL_CAPSULE | Freq: Every day | ORAL | Status: DC

## 2011-08-28 MED ORDER — ATORVASTATIN CALCIUM 10 MG PO TABS: 10.0000 mg | ORAL_TABLET | Freq: Every day | ORAL | Status: DC

## 2011-08-28 NOTE — Progress Notes (Signed)
ANTICOAGULATION CONSULT NOTE - Initial Consult  Pharmacy Consult for Coumadin Indication: atrial fibrillation  No Known Allergies  Patient Measurements: Height: 5\' 11"  (180.3 cm) Weight: 147 lb 6.4 oz (66.86 kg) IBW/kg (Calculated) : 75.3   Vital Signs: Temp: 98.6 F (37 C) (06/01 0635) Temp src: Oral (06/01 0635) BP: 152/64 mmHg (06/01 1058) Pulse Rate: 67  (06/01 1058)  Labs:  Basename 08/28/11 0727 08/27/11 1520 08/27/11 0700 08/27/11 0424 08/26/11 2351 08/26/11 2019  HGB 11.0* -- -- 11.2* -- --  HCT 33.2* -- -- 33.7* -- 34.8*  PLT 122* -- -- 127* -- 119*  APTT -- -- -- -- 29 --  LABPROT 13.8 -- -- -- 14.4 --  INR 1.04 -- -- -- 1.10 --  HEPARINUNFRC -- -- -- -- -- --  CREATININE 1.05 -- -- 1.23 -- 1.47*  CKTOTAL -- 83 69 -- 94 --  CKMB -- 3.6 3.2 -- 3.5 --  TROPONINI -- <0.30 <0.30 -- <0.30 --    Estimated Creatinine Clearance: 47.8 ml/min (by C-G formula based on Cr of 1.05).   Medical History: Past Medical History  Diagnosis Date  . ASCVD (arteriosclerotic cardiovascular disease)     CABG 03/2001, stress nuclear in 2006-mild inferior ischemia; normal EF; Echocardiogram in 08/2006-normal LV/EF, no significant valvular abnormalities  . Hyperlipidemia   . DJD (degenerative joint disease)     Neck, shoulder and knees  . BPH (benign prostatic hypertrophy)   . Cerebrovascular disease 01/2005    Left carotid bruit; plaque without focal stenosis in 01/2005; 2012-moderate plaque; increased velocity in the external carotids; no significant focal internal carotid artery stenosis; plaque ulceration in the right carotid bulb  . PAF (paroxysmal atrial fibrillation)     postoperative  . Hypertension     unspecified  . Tobacco abuse, in remission     discontinued in 1990    Medications:  Scheduled:     . aspirin EC  81 mg Oral Daily  . atorvastatin  10 mg Oral q1800  . brimonidine  1 drop Right Eye Q12H   And  . timolol  1 drop Right Eye Q12H  . diltiazem  120 mg  Oral Daily  . enoxaparin  60 mg Subcutaneous Q12H  . metoprolol  50 mg Oral BID  . patient's guide to using coumadin book   Does not apply Once  . warfarin  5 mg Oral ONCE-1800  . warfarin   Does not apply Once  . Warfarin - Pharmacist Dosing Inpatient   Does not apply q1800  . DISCONTD: amLODipine  5 mg Oral Daily  . DISCONTD: simvastatin  20 mg Oral q1800    Assessment: 78 yo M with new onset Afib currently in NSR to start on Coumadin.  CHADS2=2.  No history of bleeding noted.  He is currently on full dose Lovenox for anticoagulation.   No change in INR after initial dose of warfarin.   Goal of Therapy:  INR 2-3 Monitor platelets by anticoagulation protocol: Yes 4hr antiXa level 0.6-1.2   Plan:  1) Repeat warfarin 5mg  po x 1 dose today.  2) Daily INR 3) Lovenox to 60mg  sq q12h until INR therapeutic.   Elson Clan 08/28/2011,11:08 AM

## 2011-08-28 NOTE — Discharge Summary (Signed)
Physician Discharge Summary  Patient ID: [name] MRN: [id number] DOB/AGE: 06/04/1923 78 y.o.  Admit date: 08/26/2011 Discharge date: 08/28/2011  Primary Care Physician:  Kirk Ruths, MD, MD   Discharge Diagnoses:    Active Problems:  BENIGN PROSTATIC HYPERTROPHY, HX OF  ASCVD (arteriosclerotic cardiovascular disease)  DJD (degenerative joint disease)  Hypertension  Syncope, near  Atrial flutter, paroxysmal, now in sinus Dehydration    Medication List  As of 08/28/2011 12:38 PM   STOP taking these medications         amLODipine 5 MG tablet      furosemide 20 MG tablet         TAKE these medications         aspirin 81 MG tablet   Take 81 mg by mouth daily.      COMBIGAN 0.2-0.5 % ophthalmic solution   Generic drug: brimonidine-timolol   Place 1 drop into the right eye every 12 (twelve) hours.      diltiazem 120 MG 24 hr capsule   Commonly known as: CARDIZEM CD   Take 1 capsule (120 mg total) by mouth daily.      HYDROcodone-acetaminophen 5-500 MG per tablet   Commonly known as: VICODIN   Take 1 tablet by mouth every 4 (four) hours as needed. TAKE ONE TABLET BY MOUTH EVERY 4 TO 6 HOURS AS NEEDED FOR PAIN      lisinopril-hydrochlorothiazide 20-12.5 MG per tablet   Commonly known as: PRINZIDE,ZESTORETIC   Take 2 tablets by mouth daily.      meloxicam 15 MG tablet   Commonly known as: MOBIC   Take 15 mg by mouth daily. DO NOT TAKE RELAFEN (NAMEBUMETONE) 500MG  WHILE TAKING THIS MEDICATION (per pharmacy record instructions)      metoprolol 50 MG tablet   Commonly known as: LOPRESSOR   TAKE 1 TABLET BY MOUTH TWICE DAILY      nabumetone 500 MG tablet   Commonly known as: RELAFEN   Take 500 mg by mouth 2 (two) times daily with a meal. DO NOT TAKE MOBIC (MELOXICAM) 15MG  WHILE TAKING THIS MEDICATION (per pharmacy record)      simvastatin 20 MG tablet   Commonly known as: ZOCOR      STOOL SOFTENER 100 MG capsule   Generic drug: docusate sodium   Take 100  mg by mouth as needed. For constipation           Discharge Exam: Blood pressure 152/64, pulse 67, temperature 98.6 F (37 C), temperature source Oral, resp. rate 20, height 5\' 11"  (1.803 m), weight 66.86 kg (147 lb 6.4 oz), SpO2 99.00%. NAD CTA B S1, S2, RRR Soft, NT, BS+ No edema b/l  Disposition and Follow-up:  Follow up with Dr. Dietrich Pates in 1 week Follow up with primary doctor in 1 week  Consults:  Cardiology, Dr. Dietrich Pates   Significant Diagnostic Studies:  Dg Chest Port 1 View  08/26/2011  *RADIOLOGY REPORT*  Clinical Data: Generalized weakness.  Constipation.  Ex-smoker.  PORTABLE CHEST - 1 VIEW  Comparison: Previous examinations.  Findings: Normal sized heart.  Clear lungs.  The lungs remain hyperexpanded.  Stable post CABG changes and right humeral head prosthesis.  Left shoulder degenerative changes with superior migration of the humeral head compatible with a chronic rotator cuff tear.  IMPRESSION:  1.  No acute abnormality. 2.  Stable changes of COPD. 3.  Left shoulder degenerative changes with evidence of a chronic rotator cuff tear.  Original Report Authenticated By: Viviann Spare  Julianne Handler, M.D.    Brief H and P: For complete details please refer to admission H and P, but in brief [name] is an 78 y.o. male. Coronary artery disease status post CABG, paroxysmal atrial fibrillation, arthritis on Vicodin, took a laxative for constipation today, began to have and had an episode of dizziness and diaphoresis before having a large loose stool at home and to further loose stool here in the hospital.  She was brought to the hospital because of the dizziness and sweating and was found to be persistent a rhythm suggestive of atrial flutter; he has not responded to a bolus of Cardizem and the hospitalist service has been called to assist with management.  Patient denies chest pain or palpitations or shortness of breath. Denies recent leg swelling. Denies black or bloody stool.  Patient  appears to be somewhat hard of hearing and is also very guarded during the interview, which makes the history difficult. Most information was obtained from his daughter and wife who are present.  He denies skipping any of his medications.     Hospital Course:  This gentleman was admitted to the hospital with dizziness and diaphoresis and was found to be in rapid a flutter.  He was admitted to the step down unit and was started on a cardizem infusion.  He spontaneously converted to sinus rhythm.  He was continued on his metoprolol and norvasc was discontinued in favor of cardizem.  He has maintained sinus rhythm and is doing very well.  He wants to go home. He was seen by cardiology who did not feel he was a good candidate for chronic anticoagulation.  He has been continued on aspirin.  He had a 2D echo done with results pending.  This can be followed up by his cardiologist.  The remainder of his medical issues have remained stable.  Time spent on Discharge:  Signed: Fin Hupp [facility] Pager: [phone] 08/28/2011, 12:38 PM

## 2011-08-28 NOTE — Progress Notes (Signed)
Reviewed discharge instructions with patient and patient's wife and daughter, all voiced understanding. Patient's daughter given discharge instructions and prescriptions per patient's request. Patient in stable condition and transported out by tech.

## 2011-08-28 NOTE — Discharge Instructions (Signed)
Atrial Flutter  Atrial flutter is a heart rhythm that can cause the heart to beat very fast (tachycardia). It originates in the upper chambers of the heart (atria). In atrial flutter, the top chambers of the heart (atria) often beat much faster than the bottom chambers of the heart (ventricles). Atrial flutter has a regular "saw toothed" appearance in an EKG readout. An EKG is a test that records the electrical activity of the heart. Atrial flutter can cause the heart to beat up to 150 beats per minute (BPM). Atrial flutter can either be short lived (paroxysmal) or permanent.   CAUSES   Causes of atrial flutter can be many. Some of these include:   Heart related issues:   Heart attack (myocardial infarction).   Heart failure.   Heart valve problems.   Poorly controlled high blood pressure (hypertension).   Afteropen heart surgery.   Lung related issues:   A blood clot in the lungs (pulmonary embolism).   Chronic obstructive pulmonary disease (COPD). Medications used to treat COPD can attribute to atrial flutter.   Other related causes:   Hyperthyroidism.   Caffeine.   Some decongestant cold medications.   Low electrolyte levels such as potassium or magnesium.   Cocaine.  SYMPTOMS   An awareness of your heart beating rapidly (palpitations).   Shortness of breath.   Chest pain.   Low blood pressure (hypotension).   Dizziness or fainting.  DIAGNOSIS   Different tests can be performed to diagnose atrial flutter.    An EKG.   Holter monitor. This is a 24 hour recording of your heart rhythm. You will also be given a diary. Write down all symptoms that you have and what you were doing at the time you experienced symptoms.   Cardiac event monitor. This small device can be worn for up to 30 days. When you have heart symptoms, you will push a button on the device. This will then record your heart rhythm.   Echocardiogram. This is an imaging test to look at your heart. Your caregiver will look at your  heart valves and the ventricles.   Stress Test. This test can help determine if the atrial flutter is related to exercise or if coronary artery disease is present.   Laboratory studies will look at certain blood levels like:   Complete blood count (CBC).   Potassium.   Magnesium.   Thyroid function.  TREATMENT   Treatment of atrial flutter varies. A combination of therapies may be used or sometimes atrial flutter may need only 1 type of treatment.   Lab work:  If your blood work, such as your electrolytes (potassium, magnesium) or your thyroid function tests are abnormal, your caregiver will treat them accordingly.   Medication:   There are several different types of medications that can convert your heart to a normal rhythm and prevent atrial flutter from reoccurring.   Nonsurgical procedures:  Nonsurgical techniques may be used to control atrial flutter. Some examples include:   Cardioversion. This technique uses either drugs or an electrical shock to restore a normal heart rhythm:   Cardioversion drugs may be given through an intravenous (IV) line to help "reset" the heart rhythm.   In electrical cardioversion, your caregiver shocks your heart with electrical energy. This helps to reset the heartbeat to a normal rhythm.   Ablation. If atrial flutter is a persistent problem, an ablation may be needed. This procedure is done under mild sedation. High frequency radio-wave energy is used to   destroy the area of heart tissue responsible for atrial flutter.  SEEK IMMEDIATE MEDICAL CARE IF:    Dizziness.   Near fainting or fainting.   Shortness of breath.   Chest pain or pressure.   Sudden nausea or vomiting.   Profuse sweating.  If you have the above symptoms, call your local emergency service immediately! Do not drive yourself to the hospital.  MAKE SURE YOU:    Understand these instructions.   Will watch your condition.   Will get help right away if you are not doing well or get worse.  Document  Released: 08/01/2008 Document Revised: 03/04/2011 Document Reviewed: 08/01/2008  [employer] Patient Information 2012 [employer].

## 2011-08-29 ENCOUNTER — Other Ambulatory Visit: Payer: Self-pay | Admitting: Adult Health

## 2011-08-31 ENCOUNTER — Encounter: Payer: Self-pay | Admitting: Adult Health

## 2011-08-31 ENCOUNTER — Ambulatory Visit (INDEPENDENT_AMBULATORY_CARE_PROVIDER_SITE_OTHER): Payer: 59 | Admitting: Adult Health

## 2011-08-31 VITALS — BP 138/60 | HR 64 | Ht 66.5 in | Wt 145.1 lb

## 2011-08-31 DIAGNOSIS — I7090 Unspecified atherosclerosis: Secondary | ICD-10-CM

## 2011-08-31 DIAGNOSIS — I10 Essential (primary) hypertension: Secondary | ICD-10-CM

## 2011-08-31 DIAGNOSIS — I480 Paroxysmal atrial fibrillation: Secondary | ICD-10-CM

## 2011-08-31 DIAGNOSIS — I4891 Unspecified atrial fibrillation: Secondary | ICD-10-CM

## 2011-08-31 DIAGNOSIS — I2510 Atherosclerotic heart disease of native coronary artery without angina pectoris: Secondary | ICD-10-CM

## 2011-08-31 NOTE — Patient Instructions (Signed)
**Note De-identified  Obfuscation** Your physician recommends that you continue on your current medications as directed. Please refer to the Current Medication list given to you today.  Your physician recommends that you schedule a follow-up appointment in: 6 months  

## 2011-08-31 NOTE — Assessment & Plan Note (Signed)
Blood pressure is well controlled. No changes on medications at this time. We will see him in 6 months.

## 2011-08-31 NOTE — Progress Notes (Signed)
HPI: Mr. Selsor is a very pleasant 78 y/o patient of Dr. Dietrich Pates we are seeing on hospital follow-up after presenting for atrial fib with RVR, in the setting of dehydration and near syncope. This was converted to NSR on cardizem gtt. He has history of PAF, CAD s./p CABG, carotid artery disease, and hypertension. He was not placed on anticoagulation regimen with the exception of ASA. He comes today without complaints. Echocardiogram was completed with "probably normal": LVEF.  The left atrium was in the upper limits of normal. He has some mild memory deficits and is not completely remembering his hospitalization. His wife helps him with his medications, and he remains compliant. He denies any further complaints of near syncope.   No Known Allergies  Current Outpatient Prescriptions  Medication Sig Dispense Refill  . aspirin 81 MG tablet Take 81 mg by mouth daily.        . brimonidine-timolol (COMBIGAN) 0.2-0.5 % ophthalmic solution Place 1 drop into the right eye every 12 (twelve) hours.      Marland Kitchen diltiazem (CARDIZEM CD) 120 MG 24 hr capsule Take 1 capsule (120 mg total) by mouth daily.  30 capsule  1  . docusate sodium (STOOL SOFTENER) 100 MG capsule Take 100 mg by mouth as needed. For constipation      . HYDROcodone-acetaminophen (VICODIN) 5-500 MG per tablet Take 1 tablet by mouth every 4 (four) hours as needed. TAKE ONE TABLET BY MOUTH EVERY 4 TO 6 HOURS AS NEEDED FOR PAIN      . lisinopril-hydrochlorothiazide (PRINZIDE,ZESTORETIC) 20-12.5 MG per tablet Take 2 tablets by mouth daily.  60 tablet  12  . meloxicam (MOBIC) 15 MG tablet Take 15 mg by mouth daily. DO NOT TAKE RELAFEN (NAMEBUMETONE) 500MG  WHILE TAKING THIS MEDICATION (per pharmacy record instructions)      . metoprolol (LOPRESSOR) 50 MG tablet TAKE 1 TABLET BY MOUTH TWICE DAILY  60 tablet  4  . nabumetone (RELAFEN) 500 MG tablet Take 500 mg by mouth 2 (two) times daily with a meal. DO NOT TAKE MOBIC (MELOXICAM) 15MG  WHILE TAKING THIS  MEDICATION (per pharmacy record)      . simvastatin (ZOCOR) 20 MG tablet TAKE ONE TABLET BY MOUTH DAILY  90 tablet  0    Past Medical History  Diagnosis Date  . ASCVD (arteriosclerotic cardiovascular disease)     CABG 03/2001, stress nuclear in 2006-mild inferior ischemia; normal EF; Echocardiogram in 08/2006-normal LV/EF, no significant valvular abnormalities  . Hyperlipidemia   . DJD (degenerative joint disease)     Neck, shoulder and knees  . BPH (benign prostatic hypertrophy)   . Cerebrovascular disease 01/2005    Left carotid bruit; plaque without focal stenosis in 01/2005; 2012-moderate plaque; increased velocity in the external carotids; no significant focal internal carotid artery stenosis; plaque ulceration in the right carotid bulb  . PAF (paroxysmal atrial fibrillation)     postoperative  . Hypertension     unspecified  . Tobacco abuse, in remission     discontinued in 1990  . Paroxysmal atrial flutter     Past Surgical History  Procedure Date  . Cataract extraction w/ intraocular lens implant     Right   . Coronary artery bypass graft 2003  . Shoulder hemi-arthroplasty     Right Shoulder -   Dr.Harrison    NWG:NFAOZH of systems complete and found to be negative unless listed above  PHYSICAL EXAM BP 138/60  Pulse 64  Ht 5' 6.5" (1.689 m)  Wt 145  lb 1.9 oz (65.826 kg)  BMI 23.07 kg/m2  General: Well developed, well nourished, in no acute distress Head: Eyes PERRLA, No xanthomas.   Normal cephalic and atraumati Lungs: Clear bilaterally to auscultation and percussion. Heart: HRRR S1 S2, without MRG.  Pulses are 2+ & equal.         No carotid bruit. No JVD.  No abdominal bruits. No femoral bruits. Abdomen: Bowel sounds are positive, abdomen soft and non-tender without masses or  Hernia's noted. Msk:  Back normal, normal gait. Normal strength and tone for age. Extremities: No clubbing, cyanosis or edema.  DP +1 Neuro: Alert and oriented X 3. Psych:  Good affect,  responds appropriately  EKG:NSR rate of 64 bpm. Left axis deviation. Anteroseptal infarct.   ASSESSMENT AND PLAN

## 2011-08-31 NOTE — Assessment & Plan Note (Signed)
He offers no cardiac complaint. No changes in his medications. He will see [place] again in 6 months.

## 2011-08-31 NOTE — Assessment & Plan Note (Signed)
Thought to be related to dehydration. Now in NSR with no further complaints of palpitations or near syncope. He will continue on ASA only. CHADs score is 2 for age and hypertension. Secondary to difficulty with memory and history of noncompliance issues, will not place on any anticoagulation at this time other than ASA.

## 2011-10-24 ENCOUNTER — Other Ambulatory Visit: Payer: Self-pay | Admitting: Internal Medicine

## 2011-10-26 ENCOUNTER — Other Ambulatory Visit: Payer: Self-pay | Admitting: Adult Health

## 2011-12-13 ENCOUNTER — Telehealth: Payer: Self-pay | Admitting: Adult Health

## 2011-12-13 NOTE — Telephone Encounter (Signed)
PHARMACY ADVANCED THE PATIENT SOME SIMVASTATIN, THEY RECEIVED BACK A MESSAGE THAT THE PATIENT NEEDED TO CALL us BACK BEFORE WE COULD FILL RX. PT HAS ALREADY SCHEDULED NEXT APPT.

## 2011-12-14 MED ORDER — SIMVASTATIN 20 MG PO TABS: 20.0000 mg | ORAL_TABLET | Freq: Every day | ORAL | Status: DC

## 2011-12-14 NOTE — Telephone Encounter (Signed)
rx sent into pharmacy, pt notifed

## 2012-01-13 ENCOUNTER — Other Ambulatory Visit: Payer: Self-pay | Admitting: Cardiology

## 2012-01-27 ENCOUNTER — Other Ambulatory Visit: Payer: Self-pay | Admitting: Adult Health

## 2012-03-06 ENCOUNTER — Ambulatory Visit (INDEPENDENT_AMBULATORY_CARE_PROVIDER_SITE_OTHER): Payer: Medicare Other | Admitting: Cardiology

## 2012-03-06 ENCOUNTER — Encounter: Payer: Self-pay | Admitting: Cardiology

## 2012-03-06 VITALS — BP 148/60 | HR 56 | Ht 68.5 in | Wt 143.1 lb

## 2012-03-06 DIAGNOSIS — I480 Paroxysmal atrial fibrillation: Secondary | ICD-10-CM

## 2012-03-06 DIAGNOSIS — I10 Essential (primary) hypertension: Secondary | ICD-10-CM

## 2012-03-06 DIAGNOSIS — I4891 Unspecified atrial fibrillation: Secondary | ICD-10-CM

## 2012-03-06 DIAGNOSIS — E785 Hyperlipidemia, unspecified: Secondary | ICD-10-CM

## 2012-03-06 DIAGNOSIS — I2510 Atherosclerotic heart disease of native coronary artery without angina pectoris: Secondary | ICD-10-CM

## 2012-03-06 NOTE — Assessment & Plan Note (Signed)
Will request most recent lipid profile from Dr. Regino Schultze.

## 2012-03-06 NOTE — Progress Notes (Signed)
Clinical Summary [name] is a pleasant 78 y.o.male, former patient of Dr. Dietrich Pates, placed on my schedule today for a routine visit. He was seen by Ms. Lawrence NP in June. History reviewed below.  He is here with his wife. States that he has had no significant angina, still stays as active as he is able. Uses a cane to walk, denies any recent falls.  Lab work from June reviewed finding potassium 3.8, BUN 22, creatinine 1.0, hemoglobin 11.0, MCV 94.6, platelets 122. ECG at that time showed sinus rhythm with probable old anterior infarct pattern, leftward axis.  Today's ECG shows sinus bradycardia with LAFB and PRWP. He reports having recent lab work with Dr. Regino Schultze including lipids with no recent change in his medications.   No Known Allergies  Current Outpatient Prescriptions  Medication Sig Dispense Refill  . aspirin 81 MG tablet Take 81 mg by mouth daily.        . brimonidine-timolol (COMBIGAN) 0.2-0.5 % ophthalmic solution Place 1 drop into the right eye every 12 (twelve) hours.      Marland Kitchen diltiazem (CARDIZEM CD) 120 MG 24 hr capsule Take 1 capsule (120 mg total) by mouth daily.  30 capsule  1  . docusate sodium (STOOL SOFTENER) 100 MG capsule Take 100 mg by mouth as needed. For constipation      . furosemide (LASIX) 20 MG tablet Take 20 mg by mouth as needed.      Marland Kitchen HYDROcodone-acetaminophen (VICODIN) 5-500 MG per tablet Take 1 tablet by mouth every 4 (four) hours as needed. TAKE ONE TABLET BY MOUTH EVERY 4 TO 6 HOURS AS NEEDED FOR PAIN      . lisinopril-hydrochlorothiazide (PRINZIDE,ZESTORETIC) 20-12.5 MG per tablet TAKE 2 TABLETS BY MOUTH EVERY DAY  60 tablet  3  . metoprolol (LOPRESSOR) 50 MG tablet TAKE 1 TABLET BY MOUTH TWICE DAILY  60 tablet  0  . simvastatin (ZOCOR) 20 MG tablet Take 1 tablet (20 mg total) by mouth daily.  90 tablet  0    Past Medical History  Diagnosis Date  . ASCVD (arteriosclerotic cardiovascular disease)     CABG 03/2001, stress nuclear in 2006-mild  inferior ischemia; normal EF; Echocardiogram in 08/2006-normal LV/EF, no significant valvular abnormalities  . Hyperlipidemia   . DJD (degenerative joint disease)     Neck, shoulder and knees  . BPH (benign prostatic hypertrophy)   . Cerebrovascular disease 01/2005    Left carotid bruit; plaque without focal stenosis in 01/2005; 2012-moderate plaque; increased velocity in the external carotids; no significant focal internal carotid artery stenosis; plaque ulceration in the right carotid bulb  . PAF (paroxysmal atrial fibrillation)     postoperative  . Hypertension     unspecified  . Tobacco abuse, in remission     discontinued in 1990  . Paroxysmal atrial flutter     Past Surgical History  Procedure Date  . Cataract extraction w/ intraocular lens implant     Right   . Coronary artery bypass graft 2003  . Shoulder hemi-arthroplasty     Right Shoulder -   Dr.Harrison    Family History  Problem Relation Age of Onset  . Sudden death Father 51    Social History Mr. Kramme reports that he has quit smoking. He does not have any smokeless tobacco history on file. Mr. Pomplun reports that he does not drink alcohol.  Review of Systems No palpitations or breathlessness. No reported bleeding episodes. No syncope. Stable appetite. Otherwise negative.  Physical Examination  Filed Vitals:   03/06/12 1304  BP: 148/60  Pulse: 56   Filed Weights   03/06/12 1304  Weight: 143 lb 1.9 oz (64.919 kg)   Patient in no acute distress. HEENT: Conjunctiva and lids normal, oropharynx clear. Neck: Supple, no elevated JVP, sofft right carotid bruit, no thyromegaly. Lungs: Clear to auscultation, nonlabored breathing at rest. Cardiac: Regular rate and rhythm, no S3, soft systolic murmur, no pericardial rub. Abdomen: Soft, nontender, bowel sounds present, no guarding or rebound. Extremities: No pitting edema, distal pulses 2+. Skin: Warm and dry. Musculoskeletal: No kyphosis. Neuropsychiatric: Alert  and oriented x3, affect grossly appropriate.   Problem List and Plan   Coronary atherosclerosis of native coronary artery Multivessel disease status post CABG in 2003, mild inferior ischemia by Myoview 2006 that has been managed medically. He reports no accelerating angina and ECG is stable. Continue observation, followup in 6 months.  Hyperlipidemia Will request most recent lipid profile from Dr. Regino Schultze.  Hypertension No change to current regimen.  PAF (paroxysmal atrial fibrillation) No recurrences, was noted postoperatively. Remains in sinus rhythm now, no plan for anticoagulation.    Jonelle Sidle, M.D., F.A.C.C.

## 2012-03-06 NOTE — Assessment & Plan Note (Signed)
No change to current regimen. 

## 2012-03-06 NOTE — Assessment & Plan Note (Signed)
No recurrences, was noted postoperatively. Remains in sinus rhythm now, no plan for anticoagulation.

## 2012-03-06 NOTE — Patient Instructions (Addendum)
Your physician recommends that you schedule a follow-up appointment in: 6 MONTHS WITH SM 

## 2012-03-06 NOTE — Assessment & Plan Note (Signed)
Multivessel disease status post CABG in 2003, mild inferior ischemia by Myoview 2006 that has been managed medically. He reports no accelerating angina and ECG is stable. Continue observation, followup in 6 months.

## 2012-04-27 ENCOUNTER — Other Ambulatory Visit: Payer: Self-pay | Admitting: Adult Health

## 2012-05-15 ENCOUNTER — Other Ambulatory Visit: Payer: Self-pay | Admitting: Cardiology

## 2012-06-08 ENCOUNTER — Other Ambulatory Visit: Payer: Self-pay | Admitting: Cardiology

## 2012-07-27 ENCOUNTER — Emergency Department (HOSPITAL_COMMUNITY): Payer: Medicare Other

## 2012-07-27 ENCOUNTER — Emergency Department (HOSPITAL_COMMUNITY)
Admission: EM | Admit: 2012-07-27 | Discharge: 2012-07-27 | Disposition: A | Discharge status: Home / Self Care | Payer: Medicare Other | Attending: Emergency Medicine | Admitting: Emergency Medicine

## 2012-07-27 ENCOUNTER — Encounter (HOSPITAL_COMMUNITY): Payer: Self-pay | Admitting: Emergency Medicine

## 2012-07-27 DIAGNOSIS — E785 Hyperlipidemia, unspecified: Secondary | ICD-10-CM | POA: Insufficient documentation

## 2012-07-27 DIAGNOSIS — R0602 Shortness of breath: Secondary | ICD-10-CM | POA: Insufficient documentation

## 2012-07-27 DIAGNOSIS — R002 Palpitations: Secondary | ICD-10-CM | POA: Insufficient documentation

## 2012-07-27 DIAGNOSIS — R011 Cardiac murmur, unspecified: Secondary | ICD-10-CM | POA: Insufficient documentation

## 2012-07-27 DIAGNOSIS — R269 Unspecified abnormalities of gait and mobility: Secondary | ICD-10-CM | POA: Insufficient documentation

## 2012-07-27 DIAGNOSIS — M19019 Primary osteoarthritis, unspecified shoulder: Secondary | ICD-10-CM | POA: Insufficient documentation

## 2012-07-27 DIAGNOSIS — M1990 Unspecified osteoarthritis, unspecified site: Secondary | ICD-10-CM

## 2012-07-27 DIAGNOSIS — Z87448 Personal history of other diseases of urinary system: Secondary | ICD-10-CM | POA: Insufficient documentation

## 2012-07-27 DIAGNOSIS — R609 Edema, unspecified: Secondary | ICD-10-CM | POA: Insufficient documentation

## 2012-07-27 DIAGNOSIS — Z87891 Personal history of nicotine dependence: Secondary | ICD-10-CM | POA: Insufficient documentation

## 2012-07-27 DIAGNOSIS — Z7982 Long term (current) use of aspirin: Secondary | ICD-10-CM | POA: Insufficient documentation

## 2012-07-27 DIAGNOSIS — I10 Essential (primary) hypertension: Secondary | ICD-10-CM | POA: Insufficient documentation

## 2012-07-27 DIAGNOSIS — M549 Dorsalgia, unspecified: Secondary | ICD-10-CM | POA: Insufficient documentation

## 2012-07-27 DIAGNOSIS — Z79899 Other long term (current) drug therapy: Secondary | ICD-10-CM | POA: Insufficient documentation

## 2012-07-27 DIAGNOSIS — Z8679 Personal history of other diseases of the circulatory system: Secondary | ICD-10-CM | POA: Insufficient documentation

## 2012-07-27 DIAGNOSIS — Z951 Presence of aortocoronary bypass graft: Secondary | ICD-10-CM | POA: Insufficient documentation

## 2012-07-27 DIAGNOSIS — I2510 Atherosclerotic heart disease of native coronary artery without angina pectoris: Secondary | ICD-10-CM | POA: Insufficient documentation

## 2012-07-27 LAB — CBC WITH DIFFERENTIAL/PLATELET
Eosinophils Absolute: 0 10*3/uL (ref 0.0–0.7)
Eosinophils Relative: 0 % (ref 0–5)
Hemoglobin: 12.1 g/dL — ABNORMAL LOW (ref 13.0–17.0)
Lymphs Abs: 1.1 10*3/uL (ref 0.7–4.0)
MCH: 30.9 pg (ref 26.0–34.0)
MCV: 92.6 fL (ref 78.0–100.0)
Monocytes Absolute: 0.7 10*3/uL (ref 0.1–1.0)
Monocytes Relative: 10 % (ref 3–12)
Platelets: 157 10*3/uL (ref 150–400)
RBC: 3.91 MIL/uL — ABNORMAL LOW (ref 4.22–5.81)

## 2012-07-27 LAB — BASIC METABOLIC PANEL
BUN: 23 mg/dL (ref 6–23)
Calcium: 9.5 mg/dL (ref 8.4–10.5)
Creatinine, Ser: 1.23 mg/dL (ref 0.50–1.35)
GFR calc non Af Amer: 51 mL/min — ABNORMAL LOW (ref >90)
Glucose, Bld: 92 mg/dL (ref 70–99)

## 2012-07-27 MED ORDER — CIPROFLOXACIN HCL 250 MG PO TABS
500.0000 mg | ORAL_TABLET | Freq: Once | ORAL | Status: AC
  Administered 2012-07-27: 500 mg via ORAL
  Filled 2012-07-27: qty 2

## 2012-07-27 MED ORDER — FUROSEMIDE 40 MG PO TABS
20.0000 mg | ORAL_TABLET | Freq: Once | ORAL | Status: AC
  Administered 2012-07-27: 20 mg via ORAL
  Filled 2012-07-27: qty 1

## 2012-07-27 MED ORDER — CIPROFLOXACIN HCL 500 MG PO TABS: 500.0000 mg | ORAL_TABLET | Freq: Two times a day (BID) | ORAL | Status: DC

## 2012-07-27 MED ORDER — HYDROCODONE-ACETAMINOPHEN 5-325 MG PO TABS
1.0000 | ORAL_TABLET | Freq: Once | ORAL | Status: AC
  Administered 2012-07-27: 1 via ORAL
  Filled 2012-07-27: qty 1

## 2012-07-27 NOTE — ED Provider Notes (Signed)
Medical screening examination/treatment/procedure(s) were performed by non-physician practitioner and as supervising physician I was immediately available for consultation/collaboration.  Crystalann Korf R. Jaliel Deavers, MD 07/27/12 1506 

## 2012-07-27 NOTE — ED Provider Notes (Signed)
History     CSN: [id number]  Arrival date & time 07/27/12  0821   First MD Initiated Contact with Patient 07/27/12 [phone]      Chief Complaint  Patient presents with  . Shortness of Breath  . Leg Swelling    (Consider location/radiation/quality/duration/timing/severity/associated sxs/prior treatment) HPI Comments: Pt is an 79 y/o male who is seen by Dr Regino Schultze. He present to the ED with c/o swelling of the lower legs for 2 weeks. His family mentioned to triage that he was having some SOB, but he says he is no more SOB than usual "at his age". No c/o chest pain, sweats, cough, or n/v. No falls. No LOC. He c/o pain of the left greater than right lower extremity. He admits to sitting a lot without having his legs elevated. He has not had recent injury, operations, or procedures to the lower extremities. No reported fever. He states he has taken his usual medications, but this is not helping the pain or the swelling.  The history is provided by the patient and a relative.    Past Medical History  Diagnosis Date  . ASCVD (arteriosclerotic cardiovascular disease)     CABG 03/2001, stress nuclear in 2006-mild inferior ischemia; normal EF; Echocardiogram in 08/2006-normal LV/EF, no significant valvular abnormalities  . Hyperlipidemia   . DJD (degenerative joint disease)     Neck, shoulder and knees  . BPH (benign prostatic hypertrophy)   . Cerebrovascular disease 01/2005    Left carotid bruit; plaque without focal stenosis in 01/2005; 2012-moderate plaque; increased velocity in the external carotids; no significant focal internal carotid artery stenosis; plaque ulceration in the right carotid bulb  . PAF (paroxysmal atrial fibrillation)     postoperative  . Hypertension     unspecified  . Tobacco abuse, in remission     discontinued in 1990  . Paroxysmal atrial flutter   . Coronary artery disease     Past Surgical History  Procedure Laterality Date  . Cataract extraction w/ intraocular  lens implant      Right   . Coronary artery bypass graft  2003  . Shoulder hemi-arthroplasty      Right Shoulder -   Dr.Harrison    Family History  Problem Relation Age of Onset  . Sudden death Father 34    History  Substance Use Topics  . Smoking status: Former Games developer  . Smokeless tobacco: Not on file  . Alcohol Use: No      Review of Systems  Constitutional: Negative for activity change.       All ROS Neg except as noted in HPI  HENT: Negative for nosebleeds and neck pain.   Eyes: Negative for photophobia and discharge.  Respiratory: Positive for shortness of breath. Negative for cough and wheezing.   Cardiovascular: Positive for palpitations and leg swelling. Negative for chest pain.  Gastrointestinal: Negative for abdominal pain and blood in stool.  Genitourinary: Negative for dysuria, frequency and hematuria.  Musculoskeletal: Positive for back pain, arthralgias and gait problem.  Skin: Negative.   Neurological: Negative for dizziness, seizures, syncope and speech difficulty.  Psychiatric/Behavioral: Negative for hallucinations and confusion.    Allergies  Review of patient's allergies indicates no known allergies.  Home Medications   Current Outpatient Rx  Name  Route  Sig  Dispense  Refill  . aspirin 81 MG tablet   Oral   Take 81 mg by mouth daily.           . brimonidine-timolol (COMBIGAN)  0.2-0.5 % ophthalmic solution   Right Eye   Place 1 drop into the right eye every 12 (twelve) hours.         Marland Kitchen diltiazem (CARDIZEM CD) 120 MG 24 hr capsule   Oral   Take 1 capsule (120 mg total) by mouth daily.   30 capsule   1   . docusate sodium (STOOL SOFTENER) 100 MG capsule   Oral   Take 100 mg by mouth as needed. For constipation         . furosemide (LASIX) 20 MG tablet   Oral   Take 20 mg by mouth as needed.         Marland Kitchen HYDROcodone-acetaminophen (VICODIN) 5-500 MG per tablet   Oral   Take 1 tablet by mouth every 4 (four) hours as needed. TAKE  ONE TABLET BY MOUTH EVERY 4 TO 6 HOURS AS NEEDED FOR PAIN         . lisinopril-hydrochlorothiazide (PRINZIDE,ZESTORETIC) 20-12.5 MG per tablet      TAKE 2 TABLETS BY MOUTH EVERY DAY   60 tablet   6   . metoprolol (LOPRESSOR) 50 MG tablet      TAKE 1 TABLET BY MOUTH TWICE DAILY   60 tablet   6   . simvastatin (ZOCOR) 20 MG tablet   Oral   Take 1 tablet (20 mg total) by mouth daily.   90 tablet   0     There were no vitals taken for this visit.  Physical Exam  Nursing note and vitals reviewed. Constitutional: He is oriented to person, place, and time. He appears well-developed and well-nourished.  Non-toxic appearance.  HENT:  Head: Normocephalic.  Right Ear: Tympanic membrane and external ear normal.  Left Ear: Tympanic membrane and external ear normal.  Eyes: EOM and lids are normal. Pupils are equal, round, and reactive to light.  Neck: Normal range of motion. Neck supple. Carotid bruit is not present.  Carotid bruits present.  Cardiovascular: Normal rate, intact distal pulses and normal pulses.  Frequent extrasystoles are present.  Murmur heard. Pulmonary/Chest: Breath sounds normal. No respiratory distress.  Pt speaks in complete sentences.  Abdominal: Soft. Bowel sounds are normal. There is no tenderness. There is no guarding.  No pulsating mass.  Musculoskeletal: Normal range of motion.  DJD changes of upper and lower extremities. There is 2+ edema of the right and left lower extremity. The left foot and ankle are red and warm. DP pulse 1+ bilat.  Lymphadenopathy:       Head (right side): No submandibular adenopathy present.       Head (left side): No submandibular adenopathy present.    He has no cervical adenopathy.  Neurological: He is alert and oriented to person, place, and time. He has normal strength. No cranial nerve deficit or sensory deficit. He exhibits normal muscle tone.  Skin: Skin is warm and dry. He is not diaphoretic. No cyanosis. No pallor.   Psychiatric: He has a normal mood and affect. His speech is normal.    ED Course  Procedures (including critical care time)  Labs Reviewed - No data to display No results found.  Pulse Ox 100% on room air. WNL by my interpretation. No diagnosis found.    MDM  I have reviewed nursing notes, vital signs, and all appropriate lab and imaging results for this patient. Patient presents to the emergency department with approximately 2 weeks of swelling of the lower extremities. Today he noted more pain and swelling  of the extremities and came to the emergency department for evaluation. The patient states that he had some shortness of breath, but this is no different from his usual shortness of breath. He has not had fever or chills related to this. He's not had any other symptoms.  A basic metabolic panel shows a slightly low potassium of 3.4 slightly elevated CO2 of 33, otherwise within normal limits. Troponin is normal at 0.30. B. natruretic peptide is elevated at 1978. Complete blood count shows a hemoglobin be slightly low at 12.1, and the hematocrit to be slightly low at 36.2, otherwise normal. Portable chest x-ray is negative for acute event. Venous ultrasound of the lower extremity with Doppler is negative for deep vein thrombosis.  The plan at this time is for the patient to improve the elevation of his legs when sitting or lying down. To continue his current medications, he already has medications for pain. Will add Cipro as upper caution in the event that the increased redness of the left foot may be early infection. I have asked the patient to be seen by his primary physician in the next 4-5 days, or return to the emergency department for reevaluation.      Kathie Dike, PA-C 07/27/12 1120

## 2012-07-27 NOTE — ED Notes (Signed)
Increased SOB and leg swelling for two weeks.

## 2012-09-11 IMAGING — CR DG CHEST 1V PORT
1 series · 1 of 1 positions shown · non-contrast
Comparison: Previous examinations.

CLINICAL DATA: Generalized weakness.  Constipation.  Ex-smoker.

PORTABLE CHEST - 1 VIEW

[view not recorded]
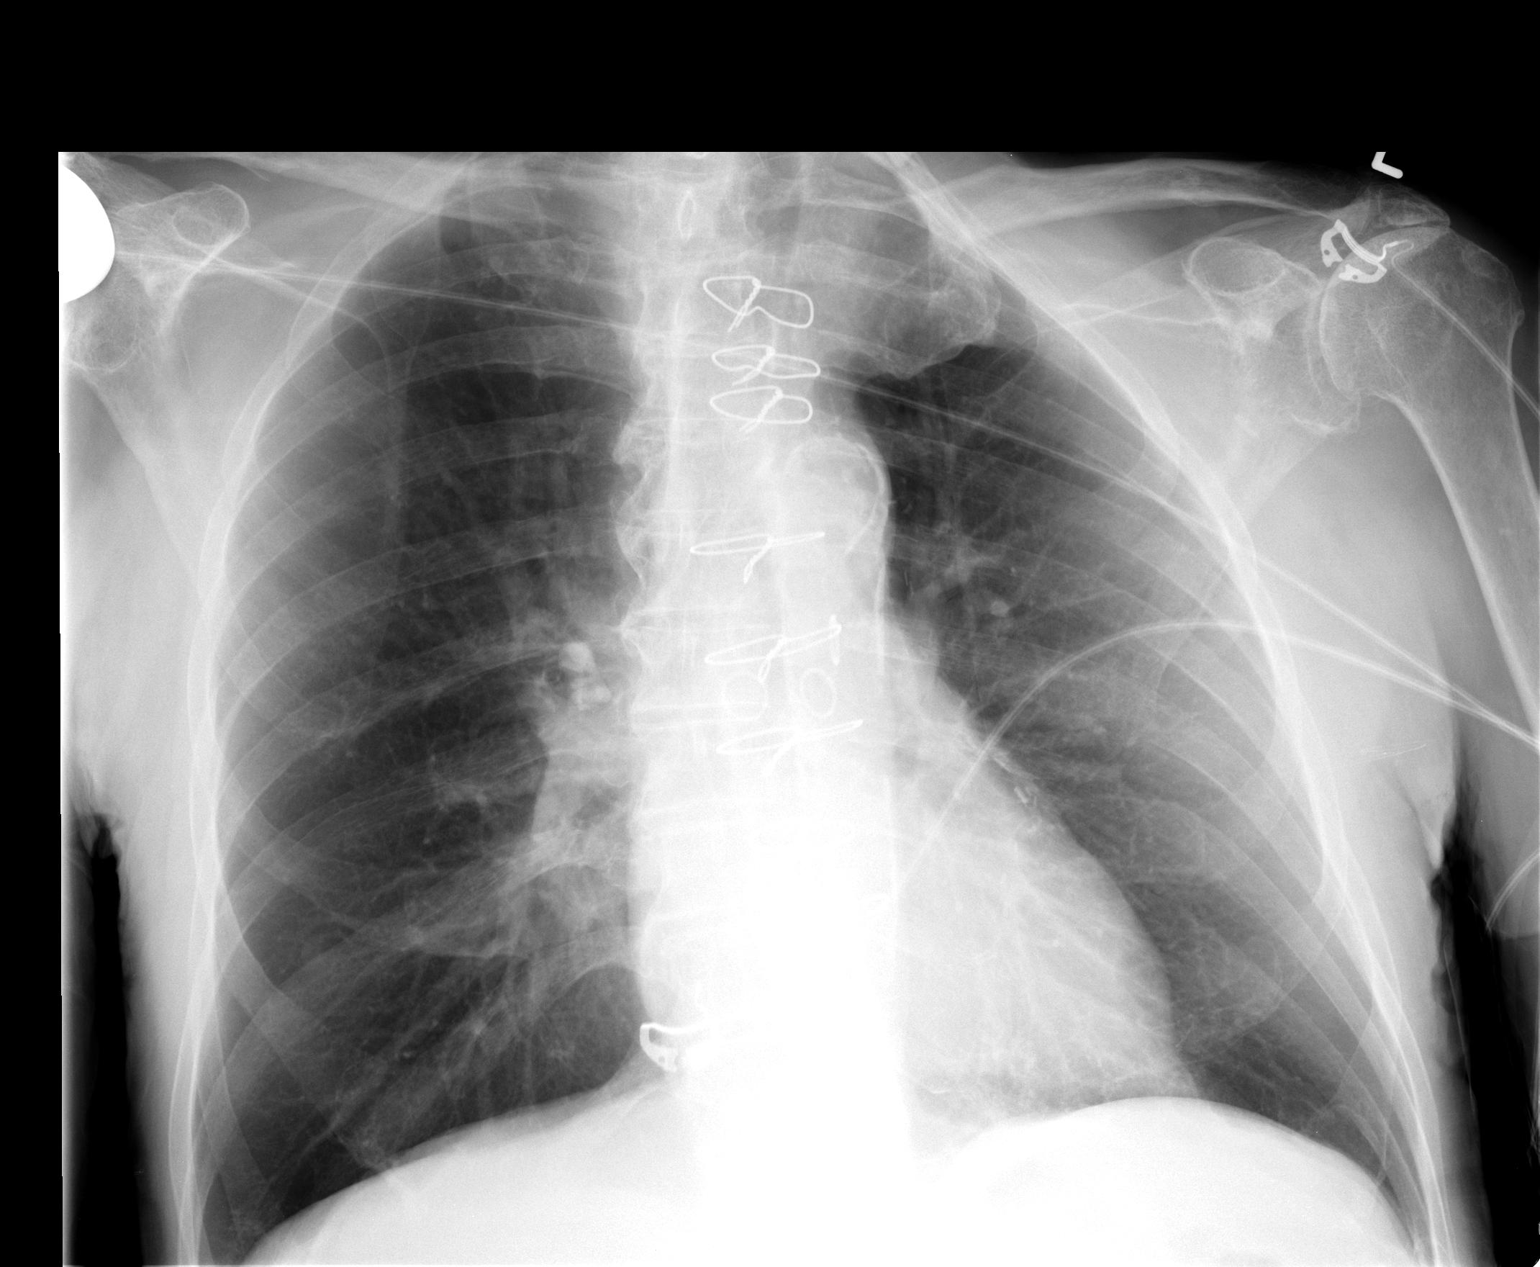

[1 of 1 positions shown; findings below may reference images not displayed]

FINDINGS: Normal sized heart.  Clear lungs.  The lungs remain
hyperexpanded.  Stable post CABG changes and right humeral head
prosthesis.  Left shoulder degenerative changes with superior
migration of the humeral head compatible with a chronic rotator
cuff tear.
IMPRESSION: 1.  No acute abnormality.
2.  Stable changes of COPD.
3.  Left shoulder degenerative changes with evidence of a chronic
rotator cuff tear.

## 2012-10-02 ENCOUNTER — Ambulatory Visit (INDEPENDENT_AMBULATORY_CARE_PROVIDER_SITE_OTHER): Payer: Medicare Other | Admitting: Adult Health

## 2012-10-02 ENCOUNTER — Encounter: Payer: Self-pay | Admitting: Adult Health

## 2012-10-02 VITALS — BP 159/71 | HR 75 | Ht 69.0 in | Wt 145.0 lb

## 2012-10-02 DIAGNOSIS — E785 Hyperlipidemia, unspecified: Secondary | ICD-10-CM

## 2012-10-02 DIAGNOSIS — I2510 Atherosclerotic heart disease of native coronary artery without angina pectoris: Secondary | ICD-10-CM

## 2012-10-02 DIAGNOSIS — I2582 Chronic total occlusion of coronary artery: Secondary | ICD-10-CM

## 2012-10-02 DIAGNOSIS — I10 Essential (primary) hypertension: Secondary | ICD-10-CM

## 2012-10-02 LAB — BASIC METABOLIC PANEL
CO2: 35 mEq/L — ABNORMAL HIGH (ref 19–32)
Calcium: 9.8 mg/dL (ref 8.4–10.5)
Creat: 1.38 mg/dL — ABNORMAL HIGH (ref 0.50–1.35)
Glucose, Bld: 97 mg/dL (ref 70–99)

## 2012-10-02 NOTE — Assessment & Plan Note (Signed)
He offers no cardiac complaints today. He continues to be active, despite chronic hip pain.  No planned cardiac testing ordered.

## 2012-10-02 NOTE — Assessment & Plan Note (Signed)
Followed by his PCP for this.

## 2012-10-02 NOTE — Assessment & Plan Note (Signed)
He is a little hypertensive on exam today. I have rechecked his BP manually. !42/80.  We will check a BMET for evaluation of his kidney status on NSAID, HCTZ and lasix. He is only supposed to be on 20 mg of lasix daily. He has two bottles in his medication bag of this. I have marked them with an "X" and told him that he only needs one of the lasix daily and to ask his daughter to review the bottles.

## 2012-10-02 NOTE — Progress Notes (Signed)
HPI: [name] is an 79 year old patient of Dr. Diona Browner we are following for ongoing assessment and management of CAD, with history of hypertension hyperlipidemia and paroxysmal atrial fibrillation. The patient was last seen by Dr. Diona Browner in December of 2013. Her last Myoview report the patient had mild inferior ischemia that has been managed medically.   The patient was seen in the emergency room on 07/27/2012 with complaints of shortness of breath and lower extremity edema. Labs during ER visit revealed a slightly low potassium at 3.4 an elevated CO2 of 33 otherwise within normal limits. Negative troponin. BNP was elevated at 1978. Venous ultrasound of the lower extremities was negative for DVT. The patient appeared to have mild cellulitis and was placed on Cipro. He was to follow with his primary care physician for further evaluation and treatment.   He comes today without complaints. Swelling is minimal. He has some confusion concerning which medications he is taking. His daughter fills his medication tray.  No Known Allergies  Current Outpatient Prescriptions  Medication Sig Dispense Refill  . aspirin 81 MG tablet Take 81 mg by mouth daily.       . brimonidine-timolol (COMBIGAN) 0.2-0.5 % ophthalmic solution Place 1 drop into the right eye 2 (two) times daily.      . furosemide (LASIX) 20 MG tablet       . HYDROcodone-acetaminophen (VICODIN) 5-500 MG per tablet Take 1 tablet by mouth daily. TAKE ONE TABLET BY MOUTH EVERY 4 TO 6 HOURS AS NEEDED FOR PAIN      . lisinopril-hydrochlorothiazide (PRINZIDE,ZESTORETIC) 20-12.5 MG per tablet       . metoprolol (LOPRESSOR) 50 MG tablet TAKE 1 TABLET BY MOUTH TWICE DAILY  60 tablet  6  . naproxen (NAPROSYN) 250 MG tablet       . simvastatin (ZOCOR) 20 MG tablet Take 1 tablet (20 mg total) by mouth daily.  90 tablet  0  . ciprofloxacin (CIPRO) 500 MG tablet Take 1 tablet (500 mg total) by mouth 2 (two) times daily.  12 tablet  0  . diltiazem  (CARDIZEM CD) 120 MG 24 hr capsule Take 1 capsule (120 mg total) by mouth daily.  30 capsule  1   No current facility-administered medications for this visit.    Past Medical History  Diagnosis Date  . ASCVD (arteriosclerotic cardiovascular disease)     CABG 03/2001, stress nuclear in 2006-mild inferior ischemia; normal EF; Echocardiogram in 08/2006-normal LV/EF, no significant valvular abnormalities  . Hyperlipidemia   . DJD (degenerative joint disease)     Neck, shoulder and knees  . BPH (benign prostatic hypertrophy)   . Cerebrovascular disease 01/2005    Left carotid bruit; plaque without focal stenosis in 01/2005; 2012-moderate plaque; increased velocity in the external carotids; no significant focal internal carotid artery stenosis; plaque ulceration in the right carotid bulb  . PAF (paroxysmal atrial fibrillation)     postoperative  . Hypertension     unspecified  . Tobacco abuse, in remission     discontinued in 1990  . Paroxysmal atrial flutter   . Coronary artery disease     Past Surgical History  Procedure Laterality Date  . Cataract extraction w/ intraocular lens implant      Right   . Coronary artery bypass graft  2003  . Shoulder hemi-arthroplasty      Right Shoulder -   Dr.Harrison    Family History  Problem Relation Age of Onset  . Sudden death Father 56  History   Social History  . Marital Status: Married    Spouse Name: N/A    Number of Children: N/A  . Years of Education: N/A   Occupational History  . Not on file.   Social History Main Topics  . Smoking status: Former Games developer  . Smokeless tobacco: Not on file  . Alcohol Use: No  . Drug Use: No  . Sexually Active: Not on file   Other Topics Concern  . Not on file   Social History Narrative  . No narrative on file    ROS: Review of systems complete and found to be negative unless listed above  PHYSICAL EXAM BP 159/71  Pulse 75  Ht 5\' 9"  (1.753 m)  Wt 145 lb (65.772 kg)  BMI 21.4  kg/m2  General: Well developed, well nourished, in no acute distress Head: Eyes PERRLA, No xanthomas.   Normal cephalic and atramatic  Lungs: Clear bilaterally to auscultation and percussion. Heart: HRRR S1 S2, soft 1.6 systolic murmur..  Pulses are 2+ & equal.            No carotid bruit. No JVD.  No abdominal bruits. No femoral bruits. Abdomen: Bowel sounds are positive, abdomen soft and non-tender without masses or                  Hernia's noted. Msk:  Back normal, normal gait. Normal strength and tone for age. Extremities: No clubbing, cyanosis, non-pitting edema.  DP +1 Neuro: Alert and oriented X 3. Psych:  Good affect, responds appropriately    ASSESSMENT AND PLAN

## 2012-10-02 NOTE — Patient Instructions (Addendum)
Your physician recommends that you schedule a follow-up appointment in: 6 MONTHS You will receive a reminder letter in the mail in about 4 months reminding you to call and schedule your appointment. If you don't receive this letter, please contact our office.  Your physician recommends that you return for lab work today.

## 2012-11-19 ENCOUNTER — Other Ambulatory Visit: Payer: Self-pay | Admitting: Cardiology

## 2013-09-23 ENCOUNTER — Emergency Department (HOSPITAL_COMMUNITY): Payer: Medicare HMO

## 2013-09-23 ENCOUNTER — Emergency Department (HOSPITAL_COMMUNITY)
Admission: EM | Admit: 2013-09-23 | Discharge: 2013-09-23 | Discharge status: Against Medical Advice | Payer: Medicare HMO | Attending: Emergency Medicine | Admitting: Emergency Medicine

## 2013-09-23 DIAGNOSIS — Z87891 Personal history of nicotine dependence: Secondary | ICD-10-CM | POA: Insufficient documentation

## 2013-09-23 DIAGNOSIS — E785 Hyperlipidemia, unspecified: Secondary | ICD-10-CM | POA: Insufficient documentation

## 2013-09-23 DIAGNOSIS — Z951 Presence of aortocoronary bypass graft: Secondary | ICD-10-CM | POA: Insufficient documentation

## 2013-09-23 DIAGNOSIS — R079 Chest pain, unspecified: Secondary | ICD-10-CM

## 2013-09-23 DIAGNOSIS — M1990 Unspecified osteoarthritis, unspecified site: Secondary | ICD-10-CM | POA: Insufficient documentation

## 2013-09-23 DIAGNOSIS — Z8673 Personal history of transient ischemic attack (TIA), and cerebral infarction without residual deficits: Secondary | ICD-10-CM | POA: Insufficient documentation

## 2013-09-23 DIAGNOSIS — I4891 Unspecified atrial fibrillation: Secondary | ICD-10-CM | POA: Insufficient documentation

## 2013-09-23 DIAGNOSIS — R4182 Altered mental status, unspecified: Secondary | ICD-10-CM

## 2013-09-23 DIAGNOSIS — I4892 Unspecified atrial flutter: Secondary | ICD-10-CM | POA: Insufficient documentation

## 2013-09-23 DIAGNOSIS — Z7982 Long term (current) use of aspirin: Secondary | ICD-10-CM | POA: Insufficient documentation

## 2013-09-23 DIAGNOSIS — I2510 Atherosclerotic heart disease of native coronary artery without angina pectoris: Secondary | ICD-10-CM | POA: Insufficient documentation

## 2013-09-23 DIAGNOSIS — I10 Essential (primary) hypertension: Secondary | ICD-10-CM | POA: Insufficient documentation

## 2013-09-23 DIAGNOSIS — Z79899 Other long term (current) drug therapy: Secondary | ICD-10-CM | POA: Insufficient documentation

## 2013-09-23 DIAGNOSIS — Z87448 Personal history of other diseases of urinary system: Secondary | ICD-10-CM | POA: Insufficient documentation

## 2013-09-23 LAB — BASIC METABOLIC PANEL
BUN: 31 mg/dL — ABNORMAL HIGH (ref 6–23)
CHLORIDE: 100 meq/L (ref 96–112)
CO2: 32 meq/L (ref 19–32)
CREATININE: 1.16 mg/dL (ref 0.50–1.35)
Calcium: 9.2 mg/dL (ref 8.4–10.5)
GFR calc Af Amer: 63 mL/min — ABNORMAL LOW (ref >90)
GFR calc non Af Amer: 54 mL/min — ABNORMAL LOW (ref >90)
GLUCOSE: 90 mg/dL (ref 70–99)
Potassium: 3.9 mEq/L (ref 3.7–5.3)
Sodium: 142 mEq/L (ref 137–147)

## 2013-09-23 LAB — CBC
HCT: 37.1 % — ABNORMAL LOW (ref 39.0–52.0)
HEMOGLOBIN: 11.8 g/dL — AB (ref 13.0–17.0)
MCH: 29.7 pg (ref 26.0–34.0)
MCHC: 31.8 g/dL (ref 30.0–36.0)
MCV: 93.5 fL (ref 78.0–100.0)
PLATELETS: 120 10*3/uL — AB (ref 150–400)
RBC: 3.97 MIL/uL — AB (ref 4.22–5.81)
RDW: 14.3 % (ref 11.5–15.5)
WBC: 5.4 10*3/uL (ref 4.0–10.5)

## 2013-09-23 LAB — I-STAT TROPONIN, ED: TROPONIN I, POC: 0 ng/mL (ref 0.00–0.08)

## 2013-09-23 LAB — TROPONIN I

## 2013-09-23 LAB — PRO B NATRIURETIC PEPTIDE: PRO B NATRI PEPTIDE: 1988 pg/mL — AB (ref 0–450)

## 2013-09-23 MED ORDER — HYDROCODONE-ACETAMINOPHEN 5-325 MG PO TABS
1.0000 | ORAL_TABLET | Freq: Once | ORAL | Status: AC
  Administered 2013-09-23: 1 via ORAL
  Filled 2013-09-23: qty 1

## 2013-09-23 NOTE — ED Notes (Addendum)
Pt in from home via [facility], pt reports sitting ha home when L Chest started to hurt, pt reports SOB with CP reports onset this morning, denies n/v/d, pt reported to have SVT in route with HR 140s, pt rcvd 324 mg ASA & x1 6 mg Adenisone PTA, per report pt continhued to have intermittent runs of SVT, pt denies pain upon arrival to ED, PT A&O x4, follows commands, speaks in complete sentences, pt hx of CABG

## 2013-09-23 NOTE — H&P (Signed)
Cardiology H&P  Note  Patient ID: [name] H Vassell, MRN: [id number], DOB/AGE: [birth year] 80 y.o. Admit date: 09/23/2013   Date of Consult: 09/23/2013 Primary Physician: Kirk RuthsMCGOUGH,WILLIAM M, MD Primary Cardiologist: Lorin Picketkathyrn Lawrence   Chief Complaint: chest pain     Assessment and Plan:  Chest pain - atypical  HTN  PAF  AMS - likely underlying dementia  Plan  admitt to tele  Obtain serial Cardiac enzymes  Continue home medications for now , he is currently asymptomatic     HPI: 80 yr old male with hx of CAD s/p CABG 2003, HLD , PAF, HTN here with chest pain   Pt currently cannot tell me why he is in the hospital . Denies any complaints. Does not know what is the year. Does not want to stay in the hospital and is being convinced by two daughters at bedside to do so. Per the daughter he ' sick in the morning ' . In the afternoon he complained of left sided chest pain that was worse with breathing . EMS enroute reported t SVT in route with HR 140s, pt rcvd 324 mg ASA & x1 6 mg Adenisone PTA, per report pt continhued to have intermittent runs of SVT,   ROS ; unobtainable due to AMS    Past Medical History  Diagnosis Date  . ASCVD (arteriosclerotic cardiovascular disease)     CABG 03/2001, stress nuclear in 2006-mild inferior ischemia; normal EF; Echocardiogram in 08/2006-normal LV/EF, no significant valvular abnormalities  . Hyperlipidemia   . DJD (degenerative joint disease)     Neck, shoulder and knees  . BPH (benign prostatic hypertrophy)   . Cerebrovascular disease 01/2005    Left carotid bruit; plaque without focal stenosis in 01/2005; 2012-moderate plaque; increased velocity in the external carotids; no significant focal internal carotid artery stenosis; plaque ulceration in the right carotid bulb  . PAF (paroxysmal atrial fibrillation)     postoperative  . Hypertension     unspecified  . Tobacco abuse, in remission     discontinued in 1990  . Paroxysmal atrial flutter   .  Coronary artery disease       Most Recent Cardiac Studies: 07/2011 echo  - Left ventricle: The cavity size was normal. Wall thickness was normal. Systolic function was probably normal. - Aortic valve: Mildly to moderately calcified annulus. - Mitral valve: Moderately calcified annulus. Mildly thickened, mildly calcified leaflets . - Atrial septum: No defect or patent foramen ovale was Identified.   EKG ;09/23/2013 NSR, LAFB , old anterior infarct      Surgical History:  Past Surgical History  Procedure Laterality Date  . Cataract extraction w/ intraocular lens implant      Right   . Coronary artery bypass graft  2003  . Shoulder hemi-arthroplasty      Right Shoulder -   Dr.Harrison     Home Meds: Prior to Admission medications   Medication Sig Start Date End Date Taking? Authorizing Provider  aspirin 81 MG tablet Take 81 mg by mouth daily.    Yes Historical Provider, MD  bimatoprost (LUMIGAN) 0.01 % SOLN Place 1 drop into the right eye at bedtime.   Yes Historical Provider, MD  brimonidine-timolol (COMBIGAN) 0.2-0.5 % ophthalmic solution Place 1 drop into the right eye daily at 6 PM.    Yes Historical Provider, MD  diltiazem (CARDIZEM CD) 120 MG 24 hr capsule Take 1 capsule (120 mg total) by mouth daily. 08/28/11 09/23/13 Yes Erick BlinksJehanzeb Memon, MD  furosemide (LASIX)  20 MG tablet Take 20 mg by mouth daily as needed for fluid.  09/26/12  Yes Historical Provider, MD  lisinopril-hydrochlorothiazide (PRINZIDE,ZESTORETIC) 20-12.5 MG per tablet Take 2 tablets by mouth daily.  06/08/12  Yes Jonelle Sidle, MD  metoprolol (LOPRESSOR) 50 MG tablet Take 50 mg by mouth 2 (two) times daily.   Yes Historical Provider, MD  naproxen (NAPROSYN) 250 MG tablet Take 250 mg by mouth daily.  09/19/12  Yes Historical Provider, MD  oxyCODONE-acetaminophen (PERCOCET/ROXICET) 5-325 MG per tablet Take 1 tablet by mouth every 6 (six) hours as needed for severe pain.   Yes Historical Provider, MD  simvastatin  (ZOCOR) 20 MG tablet Take 1 tablet (20 mg total) by mouth daily. 12/14/11  Yes Jonelle Sidle, MD    Inpatient Medications:     Allergies: No Known Allergies  History   Social History  . Marital Status: Married    Spouse Name: N/A    Number of Children: N/A  . Years of Education: N/A   Occupational History  . Not on file.   Social History Main Topics  . Smoking status: Former Games developer  . Smokeless tobacco: Not on file  . Alcohol Use: No  . Drug Use: No  . Sexual Activity: Not on file   Other Topics Concern  . Not on file   Social History Narrative  . No narrative on file     Family History  Problem Relation Age of Onset  . Sudden death Father 25    Labs:  Recent Labs  09/23/13 1738  TROPONINI <0.30   Lab Results  Component Value Date   WBC 5.4 09/23/2013   HGB 11.8* 09/23/2013   HCT 37.1* 09/23/2013   MCV 93.5 09/23/2013   PLT 120* 09/23/2013    Recent Labs Lab 09/23/13 1738  NA 142  K 3.9  CL 100  CO2 32  BUN 31*  CREATININE 1.16  CALCIUM 9.2  GLUCOSE 90   Lab Results  Component Value Date   CHOL 140 12/08/2010   HDL 42 12/08/2010   LDLCALC 85 12/08/2010   TRIG 64 12/08/2010   No results found for this basename: DDIMER    Radiology/Studies:  Dg Chest 2 View  09/23/2013   CLINICAL DATA:  Left-sided chest pain and pressure for several hr.  EXAM: CHEST  2 VIEW  COMPARISON:  Priors dating back to 05/28/2010.  FINDINGS: Emphysema. Old granulomatous disease. Cardiopericardial silhouette within normal limits. Aortic arch atherosclerosis. CABG. No pneumothorax. No airspace disease. Blunting of the costophrenic angles is identified on the lateral view, compatible with small bilateral pleural effusions. Partially visualize RIGHT shoulder arthroplasty.  IMPRESSION: Small bilateral pleural effusions only seen on the lateral view. Emphysema and old granulomatous disease.   Electronically Signed   By: Andreas Newport M.D.   On: 09/23/2013 18:49    EKG:    Physical Exam: Blood pressure 125/78, pulse 82, temperature 98 F (36.7 C), resp. rate 16, SpO2 96.00%. General: Well developed, well nourished, in no acute distress.  Neck: Negative for carotid bruits. JVD not elevated. Lungs: Clear bilaterally to auscultation without wheezes, rales, or rhonchi. Breathing is unlabored. Heart: RRR with S1 S2. No murmurs, rubs, or gallops appreciated. Abdomen: Soft, non-tender, non-distended with normoactive bowel sounds. No hepatomegaly. No rebound/guarding. No obvious abdominal masses. Extremities: No clubbing or cyanosis. No edema.  Distal pedal pulses are 2+ and equal bilaterally. Neuro: Alert and oriented X 3. No facial asymmetry. No focal deficit. Moves all extremities spontaneously.  Psych:  Responds to questions appropriately with a normal affect.     Lovina ReachSigned, YOUSUF, MIAN, A M.D  09/23/2013, 10:08 PM

## 2013-09-23 NOTE — ED Notes (Signed)
Brought pt a [place] sandwich to eat.

## 2013-09-23 NOTE — ED Provider Notes (Signed)
CSN: [id number]     Arrival date & time 09/23/13  1703 History   First MD Initiated Contact with Patient 09/23/13 1707     Chief Complaint  Patient presents with  . Chest Pain     (Consider location/radiation/quality/duration/timing/severity/associated sxs/prior Treatment) Patient is a 80 y.o. male presenting with chest pain. The history is provided by the patient and a relative.  Chest Pain Pain location:  L chest Pain radiates to:  Does not radiate Pain severity:  Moderate Onset quality:  Gradual Timing:  Constant Progression:  Resolved Chronicity:  New Relieved by:  Aspirin Worsened by:  Nothing tried Associated symptoms: shortness of breath   Associated symptoms: no abdominal pain, no cough, no diaphoresis, no dizziness, no fever, no headache and not vomiting     80 yo male h/o CABG pw chest pain. Left side. Pressure. Mild sob. Throughout the day. Uncertain if constant or intermittent. Non-radiating. No n/v. No cough or fever. Onset of cp at rest.  Reported SVT en route with EMS. Received adenosine x1 with brief improvement. Kept going in and out of SVT per report. Received ASA.  Patient denies any pain upon arrival to ED.  BLE swelling. On lasix for this. No acute change. No h/o DVT/PE.  Previously feeling well.    Past Medical History  Diagnosis Date  . ASCVD (arteriosclerotic cardiovascular disease)     CABG 03/2001, stress nuclear in 2006-mild inferior ischemia; normal EF; Echocardiogram in 08/2006-normal LV/EF, no significant valvular abnormalities  . Hyperlipidemia   . DJD (degenerative joint disease)     Neck, shoulder and knees  . BPH (benign prostatic hypertrophy)   . Cerebrovascular disease 01/2005    Left carotid bruit; plaque without focal stenosis in 01/2005; 2012-moderate plaque; increased velocity in the external carotids; no significant focal internal carotid artery stenosis; plaque ulceration in the right carotid bulb  . PAF (paroxysmal atrial  fibrillation)     postoperative  . Hypertension     unspecified  . Tobacco abuse, in remission     discontinued in 1990  . Paroxysmal atrial flutter   . Coronary artery disease    Past Surgical History  Procedure Laterality Date  . Cataract extraction w/ intraocular lens implant      Right   . Coronary artery bypass graft  2003  . Shoulder hemi-arthroplasty      Right Shoulder -   Dr.Harrison   Family History  Problem Relation Age of Onset  . Sudden death Father 4256   History  Substance Use Topics  . Smoking status: Former Games developermoker  . Smokeless tobacco: Not on file  . Alcohol Use: No    Review of Systems  Constitutional: Negative for fever, chills and diaphoresis.  HENT: Negative for congestion and rhinorrhea.   Eyes: Negative for visual disturbance.  Respiratory: Positive for shortness of breath. Negative for cough.   Cardiovascular: Positive for chest pain and leg swelling (chronic).  Gastrointestinal: Negative for vomiting, abdominal pain and diarrhea.  Genitourinary: Negative for flank pain and difficulty urinating.  Skin: Negative for color change and rash.  Neurological: Negative for dizziness and headaches.  All other systems reviewed and are negative.     Allergies  Review of patient's allergies indicates no known allergies.  Home Medications   Prior to Admission medications   Medication Sig Start Date End Date Taking? Authorizing Provider  aspirin 81 MG tablet Take 81 mg by mouth daily.    Yes Historical Provider, MD  bimatoprost (LUMIGAN) 0.01 %  SOLN Place 1 drop into the right eye at bedtime.   Yes Historical Provider, MD  brimonidine-timolol (COMBIGAN) 0.2-0.5 % ophthalmic solution Place 1 drop into the right eye daily at 6 PM.    Yes Historical Provider, MD  diltiazem (CARDIZEM CD) 120 MG 24 hr capsule Take 1 capsule (120 mg total) by mouth daily. 08/28/11 09/23/13 Yes Erick BlinksJehanzeb Memon, MD  furosemide (LASIX) 20 MG tablet Take 20 mg by mouth daily as needed  for fluid.  09/26/12  Yes Historical Provider, MD  lisinopril-hydrochlorothiazide (PRINZIDE,ZESTORETIC) 20-12.5 MG per tablet Take 2 tablets by mouth daily.  06/08/12  Yes Jonelle SidleSamuel G McDowell, MD  metoprolol (LOPRESSOR) 50 MG tablet Take 50 mg by mouth 2 (two) times daily.   Yes Historical Provider, MD  naproxen (NAPROSYN) 250 MG tablet Take 250 mg by mouth daily.  09/19/12  Yes Historical Provider, MD  oxyCODONE-acetaminophen (PERCOCET/ROXICET) 5-325 MG per tablet Take 1 tablet by mouth every 6 (six) hours as needed for severe pain.   Yes Historical Provider, MD  simvastatin (ZOCOR) 20 MG tablet Take 1 tablet (20 mg total) by mouth daily. 12/14/11  Yes Jonelle SidleSamuel G McDowell, MD   BP 151/52  Pulse 71  Resp 13  SpO2 96% Physical Exam  Nursing note and vitals reviewed. Constitutional: He is oriented to person, place, and time. He appears well-developed and well-nourished. No distress.  Sitting up in bed. NAD. Speaks in full sentences.  HENT:  Head: Normocephalic and atraumatic.  Eyes: Conjunctivae are normal. Right eye exhibits no discharge. Left eye exhibits no discharge.  Neck: No tracheal deviation present.  Cardiovascular: Normal rate, regular rhythm, normal heart sounds and intact distal pulses.   Pulmonary/Chest: Effort normal and breath sounds normal. No stridor. No respiratory distress. He has no wheezes. He has no rales. He exhibits no tenderness.  Abdominal: Soft. He exhibits no distension. There is no tenderness. There is no guarding.  Musculoskeletal: He exhibits edema (mild BLE). He exhibits no tenderness.  Neurological: He is alert and oriented to person, place, and time.  Skin: Skin is warm and dry.  Psychiatric: He has a normal mood and affect. His behavior is normal.    ED Course  Procedures (including critical care time) Labs Review Labs Reviewed  CBC  BASIC METABOLIC PANEL  PRO B NATRIURETIC PEPTIDE  TROPONIN I  I-STAT TROPOININ, ED    Imaging Review No results  found.   EKG Interpretation None      MDM   Final diagnoses:  None    80 yo male pw chest pain. H/o CABG. High risk for ACS. Doubt PE. Without s/s to suggest pe. cxr clear.  Troponin negative.  Had brief runs of apparent a. Fib RVR with EMS. In ED as well. Plan for admission to cardiology.  However, patient adamantly refusing.  GCS 15. Patient with apparent decision making capacity. Discussed with family as well. Unable to persuade him to stay. Dr. Fredderick Phenixbelfi and cardiology discussed risks of leaving including disability and death. Patient to sign out AMA. Encouraged to return at any time and to f/u closely with pcp.   Labs and imaging reviewed by myself and considered in medical decision making if ordered. Imaging interpreted by radiology.   Discussed case with Dr. Fredderick PhenixBelfi who is in agreement with assessment and plan.     Stevie Kernyan McLennan, MD 09/24/13 [phone]

## 2013-09-23 NOTE — Discharge Instructions (Signed)

## 2013-09-24 NOTE — ED Provider Notes (Signed)
I saw and evaluated the patient, reviewed the resident's note and I agree with the findings and plan.   EKG Interpretation   Date/Time:  Sunday September 23 2013 17:11:00 EDT Ventricular Rate:  68 PR Interval:  145 QRS Duration: 97 QT Interval:  409 QTC Calculation: 435 R Axis:   -87 Text Interpretation:  Sinus rhythm Supraventricular bigeminy Left anterior  fascicular block Anterior infarct, old since last tracing no significant  change Confirmed by BELFI  MD, MELANIE ([id number]) on 09/24/2013 12:48:14 AM      Pt with CP, having runs of a-fib with RVR.  Trop neg.  Plan to admit to cards, but pt is refusing admit.  Rolan BuccoMelanie Belfi, MD 09/24/13 [phone]

## 2013-12-12 ENCOUNTER — Other Ambulatory Visit: Payer: Self-pay | Admitting: Adult Health

## 2014-07-20 ENCOUNTER — Inpatient Hospital Stay (HOSPITAL_COMMUNITY)
Admission: EM | Admit: 2014-07-20 | Discharge: 2014-07-23 | DRG: 291 | Disposition: A | Discharge status: Home Health | Payer: Medicare HMO | Attending: Internal Medicine | Admitting: Internal Medicine

## 2014-07-20 ENCOUNTER — Emergency Department (HOSPITAL_COMMUNITY): Payer: Medicare HMO

## 2014-07-20 ENCOUNTER — Encounter (HOSPITAL_COMMUNITY): Payer: Self-pay | Admitting: *Deleted

## 2014-07-20 DIAGNOSIS — E785 Hyperlipidemia, unspecified: Secondary | ICD-10-CM | POA: Diagnosis present

## 2014-07-20 DIAGNOSIS — Z79891 Long term (current) use of opiate analgesic: Secondary | ICD-10-CM

## 2014-07-20 DIAGNOSIS — I509 Heart failure, unspecified: Secondary | ICD-10-CM | POA: Diagnosis not present

## 2014-07-20 DIAGNOSIS — Z951 Presence of aortocoronary bypass graft: Secondary | ICD-10-CM | POA: Diagnosis not present

## 2014-07-20 DIAGNOSIS — J189 Pneumonia, unspecified organism: Secondary | ICD-10-CM | POA: Diagnosis present

## 2014-07-20 DIAGNOSIS — Z87891 Personal history of nicotine dependence: Secondary | ICD-10-CM | POA: Diagnosis not present

## 2014-07-20 DIAGNOSIS — I10 Essential (primary) hypertension: Secondary | ICD-10-CM | POA: Diagnosis present

## 2014-07-20 DIAGNOSIS — I481 Persistent atrial fibrillation: Secondary | ICD-10-CM | POA: Diagnosis not present

## 2014-07-20 DIAGNOSIS — Z7982 Long term (current) use of aspirin: Secondary | ICD-10-CM | POA: Diagnosis not present

## 2014-07-20 DIAGNOSIS — I5033 Acute on chronic diastolic (congestive) heart failure: Secondary | ICD-10-CM | POA: Diagnosis present

## 2014-07-20 DIAGNOSIS — R0602 Shortness of breath: Secondary | ICD-10-CM | POA: Diagnosis present

## 2014-07-20 DIAGNOSIS — I248 Other forms of acute ischemic heart disease: Secondary | ICD-10-CM | POA: Diagnosis present

## 2014-07-20 DIAGNOSIS — I480 Paroxysmal atrial fibrillation: Secondary | ICD-10-CM | POA: Diagnosis present

## 2014-07-20 DIAGNOSIS — R7989 Other specified abnormal findings of blood chemistry: Secondary | ICD-10-CM | POA: Diagnosis not present

## 2014-07-20 DIAGNOSIS — Z66 Do not resuscitate: Secondary | ICD-10-CM | POA: Diagnosis present

## 2014-07-20 DIAGNOSIS — N400 Enlarged prostate without lower urinary tract symptoms: Secondary | ICD-10-CM | POA: Diagnosis present

## 2014-07-20 DIAGNOSIS — F0391 Unspecified dementia with behavioral disturbance: Secondary | ICD-10-CM | POA: Diagnosis present

## 2014-07-20 DIAGNOSIS — I2510 Atherosclerotic heart disease of native coronary artery without angina pectoris: Secondary | ICD-10-CM | POA: Diagnosis present

## 2014-07-20 DIAGNOSIS — R778 Other specified abnormalities of plasma proteins: Secondary | ICD-10-CM | POA: Insufficient documentation

## 2014-07-20 DIAGNOSIS — I4891 Unspecified atrial fibrillation: Secondary | ICD-10-CM | POA: Diagnosis not present

## 2014-07-20 HISTORY — DX: Essential (primary) hypertension: I10

## 2014-07-20 HISTORY — DX: Unspecified dementia, unspecified severity, without behavioral disturbance, psychotic disturbance, mood disturbance, and anxiety: F03.90

## 2014-07-20 HISTORY — DX: Disorder of arteries and arterioles, unspecified: I77.9

## 2014-07-20 HISTORY — DX: Atherosclerotic heart disease of native coronary artery without angina pectoris: I25.10

## 2014-07-20 HISTORY — DX: Peripheral vascular disease, unspecified: I73.9

## 2014-07-20 LAB — CBC WITH DIFFERENTIAL/PLATELET
BASOS PCT: 0 % (ref 0–1)
Basophils Absolute: 0 10*3/uL (ref 0.0–0.1)
Eosinophils Absolute: 0.1 10*3/uL (ref 0.0–0.7)
Eosinophils Relative: 1 % (ref 0–5)
HCT: 37 % — ABNORMAL LOW (ref 39.0–52.0)
Hemoglobin: 11.4 g/dL — ABNORMAL LOW (ref 13.0–17.0)
LYMPHS ABS: 1.9 10*3/uL (ref 0.7–4.0)
Lymphocytes Relative: 32 % (ref 12–46)
MCH: 30.4 pg (ref 26.0–34.0)
MCHC: 30.8 g/dL (ref 30.0–36.0)
MCV: 98.7 fL (ref 78.0–100.0)
MONO ABS: 0.5 10*3/uL (ref 0.1–1.0)
MONOS PCT: 9 % (ref 3–12)
NEUTROS ABS: 3.4 10*3/uL (ref 1.7–7.7)
Neutrophils Relative %: 58 % (ref 43–77)
Platelets: 161 10*3/uL (ref 150–400)
RBC: 3.75 MIL/uL — AB (ref 4.22–5.81)
RDW: 15 % (ref 11.5–15.5)
WBC: 5.9 10*3/uL (ref 4.0–10.5)

## 2014-07-20 LAB — COMPREHENSIVE METABOLIC PANEL
ALBUMIN: 3.2 g/dL — AB (ref 3.5–5.2)
ALK PHOS: 62 U/L (ref 39–117)
ALT: 26 U/L (ref 0–53)
ANION GAP: 10 (ref 5–15)
AST: 62 U/L — AB (ref 0–37)
BUN: 29 mg/dL — ABNORMAL HIGH (ref 6–23)
CO2: 30 mmol/L (ref 19–32)
Calcium: 9 mg/dL (ref 8.4–10.5)
Chloride: 100 mmol/L (ref 96–112)
Creatinine, Ser: 1.12 mg/dL (ref 0.50–1.35)
GFR calc Af Amer: 65 mL/min — ABNORMAL LOW (ref >90)
GFR calc non Af Amer: 56 mL/min — ABNORMAL LOW (ref >90)
Glucose, Bld: 89 mg/dL (ref 70–99)
Potassium: 3.7 mmol/L (ref 3.5–5.1)
Sodium: 140 mmol/L (ref 135–145)
TOTAL PROTEIN: 6.8 g/dL (ref 6.0–8.3)
Total Bilirubin: 0.6 mg/dL (ref 0.3–1.2)

## 2014-07-20 LAB — TROPONIN I
TROPONIN I: 0.08 ng/mL — AB (ref <0.031)
TROPONIN I: 0.08 ng/mL — AB (ref <0.031)
Troponin I: 0.09 ng/mL — ABNORMAL HIGH (ref <0.031)
Troponin I: 0.07 ng/mL — ABNORMAL HIGH (ref <0.031)

## 2014-07-20 LAB — BRAIN NATRIURETIC PEPTIDE: B Natriuretic Peptide: 467 pg/mL — ABNORMAL HIGH (ref 0.0–100.0)

## 2014-07-20 LAB — MAGNESIUM: MAGNESIUM: 1.8 mg/dL (ref 1.5–2.5)

## 2014-07-20 MED ORDER — POTASSIUM CHLORIDE CRYS ER 20 MEQ PO TBCR
20.0000 meq | EXTENDED_RELEASE_TABLET | Freq: Two times a day (BID) | ORAL | Status: DC
  Administered 2014-07-20 – 2014-07-23 (×7): 20 meq via ORAL
  Filled 2014-07-20 (×7): qty 1

## 2014-07-20 MED ORDER — BRIMONIDINE TARTRATE-TIMOLOL 0.2-0.5 % OP SOLN
1.0000 [drp] | Freq: Every day | OPHTHALMIC | Status: DC
  Filled 2014-07-20: qty 5

## 2014-07-20 MED ORDER — ONDANSETRON HCL 4 MG/2ML IJ SOLN: 4.0000 mg | Freq: Four times a day (QID) | INTRAMUSCULAR | Status: DC | PRN

## 2014-07-20 MED ORDER — BRIMONIDINE TARTRATE 0.2 % OP SOLN
1.0000 [drp] | Freq: Every day | OPHTHALMIC | Status: DC
  Administered 2014-07-20 – 2014-07-22 (×3): 1 [drp] via OPHTHALMIC
  Filled 2014-07-20: qty 5

## 2014-07-20 MED ORDER — TRAZODONE HCL 50 MG PO TABS
50.0000 mg | ORAL_TABLET | Freq: Every day | ORAL | Status: DC
  Administered 2014-07-20 – 2014-07-22 (×3): 50 mg via ORAL
  Filled 2014-07-20 (×3): qty 1

## 2014-07-20 MED ORDER — METOPROLOL TARTRATE 50 MG PO TABS
50.0000 mg | ORAL_TABLET | Freq: Two times a day (BID) | ORAL | Status: DC
  Administered 2014-07-20 – 2014-07-23 (×7): 50 mg via ORAL
  Filled 2014-07-20 (×7): qty 1

## 2014-07-20 MED ORDER — LISINOPRIL 10 MG PO TABS
20.0000 mg | ORAL_TABLET | Freq: Every day | ORAL | Status: DC
  Administered 2014-07-20 – 2014-07-23 (×4): 20 mg via ORAL
  Filled 2014-07-20 (×4): qty 2

## 2014-07-20 MED ORDER — ALBUTEROL (5 MG/ML) CONTINUOUS INHALATION SOLN: 10.0000 mg/h | INHALATION_SOLUTION | RESPIRATORY_TRACT | Status: DC

## 2014-07-20 MED ORDER — METHYLPREDNISOLONE SODIUM SUCC 125 MG IJ SOLR
125.0000 mg | Freq: Once | INTRAMUSCULAR | Status: AC
  Administered 2014-07-20: 125 mg via INTRAVENOUS
  Filled 2014-07-20: qty 2

## 2014-07-20 MED ORDER — DILTIAZEM HCL ER COATED BEADS 120 MG PO CP24
120.0000 mg | ORAL_CAPSULE | Freq: Every day | ORAL | Status: DC
  Administered 2014-07-20 – 2014-07-23 (×4): 120 mg via ORAL
  Filled 2014-07-20 (×4): qty 1

## 2014-07-20 MED ORDER — LATANOPROST 0.005 % OP SOLN
1.0000 [drp] | Freq: Every day | OPHTHALMIC | Status: DC
  Administered 2014-07-20 – 2014-07-22 (×3): 1 [drp] via OPHTHALMIC
  Filled 2014-07-20: qty 2.5

## 2014-07-20 MED ORDER — LEVOFLOXACIN IN D5W 750 MG/150ML IV SOLN
750.0000 mg | Freq: Once | INTRAVENOUS | Status: AC
  Administered 2014-07-20: 750 mg via INTRAVENOUS
  Filled 2014-07-20: qty 150

## 2014-07-20 MED ORDER — ASPIRIN EC 81 MG PO TBEC
81.0000 mg | DELAYED_RELEASE_TABLET | Freq: Every day | ORAL | Status: DC
  Administered 2014-07-20 – 2014-07-23 (×4): 81 mg via ORAL
  Filled 2014-07-20 (×4): qty 1

## 2014-07-20 MED ORDER — DILTIAZEM HCL 25 MG/5ML IV SOLN
10.0000 mg | Freq: Once | INTRAVENOUS | Status: AC
  Administered 2014-07-20: 10 mg via INTRAVENOUS
  Filled 2014-07-20: qty 5

## 2014-07-20 MED ORDER — LEVOFLOXACIN IN D5W 750 MG/150ML IV SOLN: 750.0000 mg | INTRAVENOUS | Status: DC

## 2014-07-20 MED ORDER — TIMOLOL MALEATE 0.5 % OP SOLN
1.0000 [drp] | Freq: Every day | OPHTHALMIC | Status: DC
  Administered 2014-07-20 – 2014-07-22 (×3): 1 [drp] via OPHTHALMIC
  Filled 2014-07-20: qty 5

## 2014-07-20 MED ORDER — SODIUM CHLORIDE 0.9 % IV SOLN: 250.0000 mL | INTRAVENOUS | Status: DC | PRN

## 2014-07-20 MED ORDER — LEVOFLOXACIN IN D5W 750 MG/150ML IV SOLN
750.0000 mg | INTRAVENOUS | Status: DC
  Administered 2014-07-21: 750 mg via INTRAVENOUS
  Filled 2014-07-20: qty 150

## 2014-07-20 MED ORDER — OXYCODONE-ACETAMINOPHEN 5-325 MG PO TABS
1.0000 | ORAL_TABLET | Freq: Four times a day (QID) | ORAL | Status: DC | PRN
  Administered 2014-07-20 – 2014-07-22 (×5): 1 via ORAL
  Filled 2014-07-20 (×7): qty 1

## 2014-07-20 MED ORDER — SIMVASTATIN 20 MG PO TABS
20.0000 mg | ORAL_TABLET | Freq: Every day | ORAL | Status: DC
  Administered 2014-07-20 – 2014-07-22 (×3): 20 mg via ORAL
  Filled 2014-07-20 (×3): qty 1

## 2014-07-20 MED ORDER — SODIUM CHLORIDE 0.9 % IJ SOLN: 3.0000 mL | INTRAMUSCULAR | Status: DC | PRN

## 2014-07-20 MED ORDER — HEPARIN SODIUM (PORCINE) 5000 UNIT/ML IJ SOLN
5000.0000 [IU] | Freq: Three times a day (TID) | INTRAMUSCULAR | Status: DC
  Administered 2014-07-20 – 2014-07-23 (×10): 5000 [IU] via SUBCUTANEOUS
  Filled 2014-07-20 (×10): qty 1

## 2014-07-20 MED ORDER — CETYLPYRIDINIUM CHLORIDE 0.05 % MT LIQD
7.0000 mL | Freq: Two times a day (BID) | OROMUCOSAL | Status: DC
  Administered 2014-07-20 – 2014-07-23 (×5): 7 mL via OROMUCOSAL

## 2014-07-20 MED ORDER — SODIUM CHLORIDE 0.9 % IJ SOLN
3.0000 mL | Freq: Two times a day (BID) | INTRAMUSCULAR | Status: DC
  Administered 2014-07-20 – 2014-07-22 (×6): 3 mL via INTRAVENOUS

## 2014-07-20 MED ORDER — FUROSEMIDE 10 MG/ML IJ SOLN
40.0000 mg | Freq: Two times a day (BID) | INTRAMUSCULAR | Status: DC
  Administered 2014-07-20 – 2014-07-22 (×6): 40 mg via INTRAVENOUS
  Filled 2014-07-20 (×6): qty 4

## 2014-07-20 MED ORDER — IPRATROPIUM-ALBUTEROL 0.5-2.5 (3) MG/3ML IN SOLN
3.0000 mL | RESPIRATORY_TRACT | Status: DC
  Administered 2014-07-20 (×3): 3 mL via RESPIRATORY_TRACT
  Filled 2014-07-20 (×4): qty 3

## 2014-07-20 MED ORDER — ACETAMINOPHEN 325 MG PO TABS: 650.0000 mg | ORAL_TABLET | ORAL | Status: DC | PRN

## 2014-07-20 NOTE — Progress Notes (Signed)
  Echocardiogram 2D Echocardiogram has been performed.  [name] 07/20/2014, 11:25 AM

## 2014-07-20 NOTE — H&P (Signed)
[facility] History and Physical  Jacob MornGeorge H Mende [id number] DOB: Aug 30, 1923 DOA: 07/20/2014  Referring physician: Dr. Loren Raceravid Yelverton PCP: Kirk RuthsMCGOUGH,WILLIAM M, MD  Specialists:   Chief Complaint: Shortness of breath.   HPI: [name] is a 81 y.o. male  With a history of coronary artery disease, hypertension, paroxysmal atrial fibrillation that presented to the emergency department with complaints of shortness of breath. Daughter at bedside. Patient does have a history of dementia. Neither the patient nor his daughter underwent his shortness of breath and cough started. Currently it is nonproductive. Patient denies any chest pain at this time. Patient does complain of hip pain but cannot be more specific.  In the ER, CXR showed possible pneumonia with right pleural effusion.  Patient currently afebrile, no leukocytosis.  He was found to be in Atrial fibrillation, elevated troponin 0.09 and given cardizem.  [facility] asked to admit.   Review of Systems:  Constitutional: Denies fever, chills, diaphoresis, appetite change and fatigue.  HEENT: Denies photophobia, eye pain, redness, hearing loss, ear pain, congestion, sore throat, rhinorrhea, sneezing, mouth sores, trouble swallowing, neck pain, neck stiffness and tinnitus.   Respiratory: Complains of cough and shortness of breath.   Cardiovascular: Denies chest pain, palpitations and leg swelling.  Gastrointestinal: Denies nausea, vomiting, abdominal pain, diarrhea, constipation, blood in stool and abdominal distention.  Genitourinary: Denies dysuria, urgency, frequency, hematuria, flank pain and difficulty urinating.  Musculoskeletal: Complains of hip pain. Skin: Denies pallor, rash and wound.  Neurological: Denies dizziness, seizures, syncope, weakness, light-headedness, numbness and headaches.  Hematological: Denies adenopathy. Easy bruising, personal or family bleeding history  Psychiatric/Behavioral: Denies suicidal ideation, mood  changes, confusion, nervousness, sleep disturbance and agitation  Past Medical History  Diagnosis Date  . ASCVD (arteriosclerotic cardiovascular disease)     CABG 03/2001, stress nuclear in 2006-mild inferior ischemia; normal EF; Echocardiogram in 08/2006-normal LV/EF, no significant valvular abnormalities  . Hyperlipidemia   . DJD (degenerative joint disease)     Neck, shoulder and knees  . BPH (benign prostatic hypertrophy)   . Cerebrovascular disease 01/2005    Left carotid bruit; plaque without focal stenosis in 01/2005; 2012-moderate plaque; increased velocity in the external carotids; no significant focal internal carotid artery stenosis; plaque ulceration in the right carotid bulb  . PAF (paroxysmal atrial fibrillation)     postoperative  . Hypertension     unspecified  . Tobacco abuse, in remission     discontinued in 1990  . Paroxysmal atrial flutter   . Coronary artery disease   . Dementia    Past Surgical History  Procedure Laterality Date  . Cataract extraction w/ intraocular lens implant      Right   . Coronary artery bypass graft  2003  . Shoulder hemi-arthroplasty      Right Shoulder -   Dr.Harrison  . Cardiac surgery     Social History:  reports that he has quit smoking. He does not have any smokeless tobacco history on file. He reports that he does not drink alcohol or use illicit drugs.   No Known Allergies  Family History  Problem Relation Age of Onset  . Sudden death Father 4756    Prior to Admission medications   Medication Sig Start Date End Date Taking? Authorizing Provider  aspirin 81 MG tablet Take 81 mg by mouth daily.    Yes Historical Provider, MD  bimatoprost (LUMIGAN) 0.01 % SOLN Place 1 drop into the right eye at bedtime.   Yes Historical Provider, MD  brimonidine-timolol (COMBIGAN) 0.2-0.5 % ophthalmic solution Place 1 drop into the right eye daily at 6 PM.    Yes Historical Provider, MD  diltiazem (CARDIZEM CD) 120 MG 24 hr capsule Take 1  capsule (120 mg total) by mouth daily. 08/28/11 07/20/14 Yes Erick Blinks, MD  furosemide (LASIX) 20 MG tablet Take 20 mg by mouth daily as needed for fluid.  09/26/12  Yes Historical Provider, MD  lisinopril-hydrochlorothiazide (PRINZIDE,ZESTORETIC) 20-12.5 MG per tablet Take 1 tablet by mouth daily.  06/08/12  Yes Jonelle Sidle, MD  metoprolol (LOPRESSOR) 50 MG tablet Take 50 mg by mouth once.    Yes Historical Provider, MD  oxyCODONE-acetaminophen (PERCOCET/ROXICET) 5-325 MG per tablet Take 1 tablet by mouth every 6 (six) hours as needed for severe pain.   Yes Historical Provider, MD  simvastatin (ZOCOR) 20 MG tablet Take 1 tablet (20 mg total) by mouth daily. 12/14/11  Yes Jonelle Sidle, MD  traZODone (DESYREL) 50 MG tablet Take 50 mg by mouth at bedtime.   Yes Historical Provider, MD  naproxen (NAPROSYN) 250 MG tablet Take 250 mg by mouth daily.  09/19/12   Historical Provider, MD   Physical Exam: Filed Vitals:   07/20/14 0706  BP: 137/83  Pulse: 118  Temp: 98 F (36.7 C)  Resp: 14     General: Well developed, thin, NAD, appears stated age  HEENT: NCAT, PERRLA, EOMI, Anicteic Sclera, mucous membranes moist.   Neck: Supple, no JVD, no masses  Cardiovascular: S1 S2 auscultated, irregular, 2/6SEM  Respiratory: Coarse breath sounds, diminished in right lung base  Abdomen: Soft, nontender, nondistended, + bowel sounds  Extremities: warm dry without cyanosis clubbing. Trace pitting edema in LE B/L  Neuro: AAOx3, cranial nerves grossly intact. Strength equal and bilateral in upper/lower ext  Skin: Without rashes exudates or nodules, bruising   Psych: Normal affect and demeanor, confused  Labs on Admission:  Basic Metabolic Panel:  Recent Labs Lab 07/20/14 0523  NA 140  K 3.7  CL 100  CO2 30  GLUCOSE 89  BUN 29*  CREATININE 1.12  CALCIUM 9.0   Liver Function Tests:  Recent Labs Lab 07/20/14 0523  AST 62*  ALT 26  ALKPHOS 62  BILITOT 0.6  PROT 6.8    ALBUMIN 3.2*   No results for input(s): LIPASE, AMYLASE in the last 168 hours. No results for input(s): AMMONIA in the last 168 hours. CBC:  Recent Labs Lab 07/20/14 0523  WBC 5.9  NEUTROABS 3.4  HGB 11.4*  HCT 37.0*  MCV 98.7  PLT 161   Cardiac Enzymes:  Recent Labs Lab 07/20/14 0523  TROPONINI 0.09*    BNP (last 3 results)  Recent Labs  07/20/14 0523  BNP 467.0*    ProBNP (last 3 results)  Recent Labs  09/23/13 1738  PROBNP 1988.0*    CBG: No results for input(s): GLUCAP in the last 168 hours.  Radiological Exams on Admission: Dg Chest Port 1 View  07/20/2014   CLINICAL DATA:  Acute onset of shortness of breath, wheezing and cough. Initial encounter.  EXAM: PORTABLE CHEST - 1 VIEW  COMPARISON:  Chest radiograph from 09/23/2013  FINDINGS: The lungs are well-aerated. A small to moderate right-sided pleural effusion is noted, with right basilar airspace opacification. This may reflect pneumonia or asymmetric pulmonary edema. No pneumothorax is seen.  The cardiomediastinal silhouette is borderline normal in size. The patient is status post median sternotomy. No acute osseous abnormalities are seen. A right shoulder hemiarthroplasty is grossly  unremarkable in appearance.  IMPRESSION: Small to moderate right-sided pleural effusion, with right basilar airspace opacification. This may reflect pneumonia or asymmetric pulmonary edema.   Electronically Signed   By: Roanna Raider M.D.   On: 07/20/2014 05:25    EKG: Independently reviewed. Atrial Fib, rate 125  Assessment/Plan  Acute congestive heart failure exacerbation -Patient will be admitted to telemetry -Last echocardiogram in May 2013- showed a probably normal systolic function -Echocardiogram in February 2006 showed an EF of 55-65% -Will obtain repeat echocardiogram -CXR: Small-to-moderate right-sided pleural effusion -BNP 467 -Will place patient on Lasix 40 mg IV twice a day -Will monitor daily weights,  intake and output -Continue lisinopril, metoprolol, statin, aspirin  Community-acquired pneumonia -CXR: Right basilar airspace opacification, may reflect pneumonia -Will place patient on Levaquin, obtain urine strep pneumonia and legionella antigens, sputum culture and Gram stain if possible  Dyspnea -Likely multifactorial including possible CHF exacerbation versus pneumonia -Will place patient on supplemental oxygen to maintain saturations above 92% -Continue DuoNeb treatments as needed -Treatment and plan as above  Paroxysmal atrial fibrillation -Likely secondary to the above processes -Patient is not on any anticoagulation due to fall risk -Will continue home medications of diltiazem and metoprolol  Mildly elevated troponin -Likely secondary to multifactorial causes including demand ischemia from a proximal atrial fibrillation versus congestive heart failure -Currently 0.09 -Will continue to trend -Patient currently chest pain-free  History of coronary artery disease -Patient is currently chest pain-free -Will trend troponins -Continue aspirin, lisinopril, metoprolol, statin  Hyperlipidemia -Continue statin  Arthritis/DJD -Continue home pain regimen  Hypertension -Continue metoprolol and lisinopril, IV Lasix  Dementia -Per daughter at bedside, currently stable  DVT prophylaxis: Heparin  Code Status: DNR  Condition: Guarded  Family Communication: Daughter at bedside. Admission, patients condition and plan of care including tests being ordered have been discussed with the patient and daughter who indicate understanding and agree with the plan and Code Status.  Time spent: 65 minutes  Channon Brougher D.O. [facility] Pager [phone]  If 7PM-7AM, please contact night-coverage [email or web address] Password [facility] 07/20/2014, 7:54 AM

## 2014-07-20 NOTE — Progress Notes (Signed)
Utilization review Completed Johnavon Mcclafferty RN BSN   

## 2014-07-20 NOTE — Progress Notes (Signed)
ANTIBIOTIC CONSULT NOTE - INITIAL  Pharmacy Consult for Renal Dose Adjustments (Levaquin) Indication: pneumonia  No Known Allergies  Patient Measurements: Height: 5\' 7"  (170.2 cm) Weight: 138 lb (62.596 kg) IBW/kg (Calculated) : 66.1  Vital Signs: Temp: 97.7 F (36.5 C) (04/23 0859) Temp Source: Oral (04/23 0859) BP: 105/62 mmHg (04/23 0859) Pulse Rate: 137 (04/23 0859) Intake/Output from previous day:   Intake/Output from this shift:    Labs:  Recent Labs  07/20/14 0523  WBC 5.9  HGB 11.4*  PLT 161  CREATININE 1.12   Estimated Creatinine Clearance: 39.6 mL/min (by C-G formula based on Cr of 1.12). No results for input(s): VANCOTROUGH, VANCOPEAK, VANCORANDOM, GENTTROUGH, GENTPEAK, GENTRANDOM, TOBRATROUGH, TOBRAPEAK, TOBRARND, AMIKACINPEAK, AMIKACINTROU, AMIKACIN in the last 72 hours.   Microbiology: No results found for this or any previous visit (from the past 720 hour(s)).  Medical History: Past Medical History  Diagnosis Date  . ASCVD (arteriosclerotic cardiovascular disease)     CABG 03/2001, stress nuclear in 2006-mild inferior ischemia; normal EF; Echocardiogram in 08/2006-normal LV/EF, no significant valvular abnormalities  . Hyperlipidemia   . DJD (degenerative joint disease)     Neck, shoulder and knees  . BPH (benign prostatic hypertrophy)   . Cerebrovascular disease 01/2005    Left carotid bruit; plaque without focal stenosis in 01/2005; 2012-moderate plaque; increased velocity in the external carotids; no significant focal internal carotid artery stenosis; plaque ulceration in the right carotid bulb  . PAF (paroxysmal atrial fibrillation)     postoperative  . Hypertension     unspecified  . Tobacco abuse, in remission     discontinued in 1990  . Paroxysmal atrial flutter   . Coronary artery disease   . Dementia    Anti-infectives    Start     Dose/Rate Route Frequency Ordered Stop   07/21/14 1800  levofloxacin (LEVAQUIN) IVPB 750 mg     750  mg 100 mL/hr over 90 Minutes Intravenous Every 48 hours 07/20/14 0903     07/20/14 0900  levofloxacin (LEVAQUIN) IVPB 750 mg  Status:  Discontinued     750 mg 100 mL/hr over 90 Minutes Intravenous Every 24 hours 07/20/14 0858 07/20/14 0903   07/20/14 0645  levofloxacin (LEVAQUIN) IVPB 750 mg     750 mg 100 mL/hr over 90 Minutes Intravenous  Once 07/20/14 0631 07/20/14 0815     Assessment: 81yo male admitted with SOB and suspected pna.  Levaquin given on admission. Estimated ClCr ~40-5845ml/min.  Currently afebrile.    Goal of Therapy:  Eradicate infection.  Plan:  Levaquin 750mg  IV q48hrs (renally adjusted) Switch to PO when appropriate Monitor labs, renal fxn, and cultures  Valrie HartHall, Eyoel Throgmorton A 07/20/2014,9:04 AM

## 2014-07-20 NOTE — ED Provider Notes (Signed)
CSN: [id number]     Arrival date & time 07/20/14  0441 History   First MD Initiated Contact with Patient 07/20/14 0451     Chief Complaint  Patient presents with  . Shortness of Breath     (Consider location/radiation/quality/duration/timing/severity/associated sxs/prior Treatment) HPI Patient is poor historian. He presents via EMS for shortness of breath. Coming from home with a complaint of shortness of breath. Unable to give duration of symptoms. States he has had some cough associated with this. Mesh breath sounds per EMS. Given 2 albuterol treatments in route. Patient with some improvement of symptoms. Denies any current pain including chest pain. Past Medical History  Diagnosis Date  . ASCVD (arteriosclerotic cardiovascular disease)     CABG 03/2001, stress nuclear in 2006-mild inferior ischemia; normal EF; Echocardiogram in 08/2006-normal LV/EF, no significant valvular abnormalities  . Hyperlipidemia   . DJD (degenerative joint disease)     Neck, shoulder and knees  . BPH (benign prostatic hypertrophy)   . Cerebrovascular disease 01/2005    Left carotid bruit; plaque without focal stenosis in 01/2005; 2012-moderate plaque; increased velocity in the external carotids; no significant focal internal carotid artery stenosis; plaque ulceration in the right carotid bulb  . PAF (paroxysmal atrial fibrillation)     postoperative  . Hypertension     unspecified  . Tobacco abuse, in remission     discontinued in 1990  . Paroxysmal atrial flutter   . Coronary artery disease   . Dementia    Past Surgical History  Procedure Laterality Date  . Cataract extraction w/ intraocular lens implant      Right   . Coronary artery bypass graft  2003  . Shoulder hemi-arthroplasty      Right Shoulder -   Dr.Harrison  . Cardiac surgery     Family History  Problem Relation Age of Onset  . Sudden death Father 64   History  Substance Use Topics  . Smoking status: Former Games developer  . Smokeless  tobacco: Not on file  . Alcohol Use: No    Review of Systems  Constitutional: Negative for fever and chills.  Respiratory: Positive for cough and shortness of breath.   Cardiovascular: Negative for chest pain and leg swelling.  Gastrointestinal: Negative for nausea, vomiting and abdominal pain.  Musculoskeletal: Negative for back pain.  All other systems reviewed and are negative.     Allergies  Review of patient's allergies indicates no known allergies.  Home Medications   Prior to Admission medications   Medication Sig Start Date End Date Taking? Authorizing Provider  aspirin 81 MG tablet Take 81 mg by mouth daily.    Yes Historical Provider, MD  bimatoprost (LUMIGAN) 0.01 % SOLN Place 1 drop into the right eye at bedtime.   Yes Historical Provider, MD  brimonidine-timolol (COMBIGAN) 0.2-0.5 % ophthalmic solution Place 1 drop into the right eye daily at 6 PM.    Yes Historical Provider, MD  diltiazem (CARDIZEM CD) 120 MG 24 hr capsule Take 1 capsule (120 mg total) by mouth daily. 08/28/11 07/20/14 Yes Erick Blinks, MD  furosemide (LASIX) 20 MG tablet Take 20 mg by mouth daily as needed for fluid.  09/26/12  Yes Historical Provider, MD  lisinopril-hydrochlorothiazide (PRINZIDE,ZESTORETIC) 20-12.5 MG per tablet Take 1 tablet by mouth daily.  06/08/12  Yes Jonelle Sidle, MD  metoprolol (LOPRESSOR) 50 MG tablet Take 50 mg by mouth once.    Yes Historical Provider, MD  oxyCODONE-acetaminophen (PERCOCET/ROXICET) 5-325 MG per tablet Take 1 tablet  by mouth every 6 (six) hours as needed for severe pain.   Yes Historical Provider, MD  simvastatin (ZOCOR) 20 MG tablet Take 1 tablet (20 mg total) by mouth daily. 12/14/11  Yes Jonelle SidleSamuel G McDowell, MD  traZODone (DESYREL) 50 MG tablet Take 50 mg by mouth at bedtime.   Yes Historical Provider, MD  naproxen (NAPROSYN) 250 MG tablet Take 250 mg by mouth daily.  09/19/12   Historical Provider, MD   BP 137/83 mmHg  Pulse 118  Temp(Src) 98 F (36.7 C)  (Oral)  Resp 14  Ht 5\' 7"  (1.702 m)  Wt 138 lb (62.596 kg)  BMI 21.61 kg/m2  SpO2 100% Physical Exam  Constitutional: He appears well-developed and well-nourished. No distress.  HENT:  Head: Normocephalic and atraumatic.  Mouth/Throat: Oropharynx is clear and moist. No oropharyngeal exudate.  Eyes: EOM are normal. Pupils are equal, round, and reactive to light.  Neck: Normal range of motion. Neck supple.  Cardiovascular:  Irregularly irregular  Pulmonary/Chest: Effort normal. No respiratory distress. He has no wheezes. He has rales.  Coarse breath sounds bilateral bases  Abdominal: Soft. Bowel sounds are normal. He exhibits no distension and no mass. There is no tenderness. There is no rebound and no guarding.  Musculoskeletal: Normal range of motion. He exhibits no edema or tenderness.  No calf swelling or tenderness.  Neurological: He is alert.  Oriented to person. Moves all extremities without deficit. Sensation is grossly intact.  Skin: Skin is warm and dry. No rash noted. No erythema.  Psychiatric: He has a normal mood and affect. His behavior is normal.  Nursing note and vitals reviewed.   ED Course  Procedures (including critical care time) Labs Review Labs Reviewed  CBC WITH DIFFERENTIAL/PLATELET - Abnormal; Notable for the following:    RBC 3.75 (*)    Hemoglobin 11.4 (*)    HCT 37.0 (*)    All other components within normal limits  COMPREHENSIVE METABOLIC PANEL - Abnormal; Notable for the following:    BUN 29 (*)    Albumin 3.2 (*)    AST 62 (*)    GFR calc non Af Amer 56 (*)    GFR calc Af Amer 65 (*)    All other components within normal limits  BRAIN NATRIURETIC PEPTIDE - Abnormal; Notable for the following:    B Natriuretic Peptide 467.0 (*)    All other components within normal limits  TROPONIN I - Abnormal; Notable for the following:    Troponin I 0.09 (*)    All other components within normal limits    Imaging Review Dg Chest Port 1  View  07/20/2014   CLINICAL DATA:  Acute onset of shortness of breath, wheezing and cough. Initial encounter.  EXAM: PORTABLE CHEST - 1 VIEW  COMPARISON:  Chest radiograph from 09/23/2013  FINDINGS: The lungs are well-aerated. A small to moderate right-sided pleural effusion is noted, with right basilar airspace opacification. This may reflect pneumonia or asymmetric pulmonary edema. No pneumothorax is seen.  The cardiomediastinal silhouette is borderline normal in size. The patient is status post median sternotomy. No acute osseous abnormalities are seen. A right shoulder hemiarthroplasty is grossly unremarkable in appearance.  IMPRESSION: Small to moderate right-sided pleural effusion, with right basilar airspace opacification. This may reflect pneumonia or asymmetric pulmonary edema.   Electronically Signed   By: Roanna RaiderJeffery  Chang M.D.   On: 07/20/2014 05:25     EKG Interpretation   Date/Time:  Saturday July 20 2014 04:51:17 EDT Ventricular  Rate:  125 PR Interval:    QRS Duration: 102 QT Interval:  338 QTC Calculation: 487 R Axis:   -78 Text Interpretation:  Atrial fibrillation Incomplete RBBB and LAFB  Anterior infarct, old Baseline wander in lead(s) V6 Confirmed by Ranae Palms   MD, Carman Auxier ([id number]) on 07/20/2014 6:40:27 AM      MDM   Final diagnoses:  SOB (shortness of breath)   Patient with persistent oxygen requirement. Effusion and infiltrate in right lung.  I have low suspicion for pneumonia though will cover. Likely due to pulmonary edema and CHF exacerbation. Patient has a mild elevation in his initial troponin. He continues to deny any chest pain. We'll discuss with hospitalist on-call.  Discussed with Dr Toniann Fail. Hospitalist will see pt and disposition.   Loren Racer, MD 07/20/14 [phone]

## 2014-07-20 NOTE — ED Notes (Addendum)
Pt brought in for sob by [facility]. EMS reports wheezing and coughing. EMS gave 2 breathing treatments.

## 2014-07-20 NOTE — ED Notes (Signed)
Pt is very pleasant and alert, family at bedside. Patient has been informed of admission, family to stay with pt for comfort.

## 2014-07-21 ENCOUNTER — Inpatient Hospital Stay (HOSPITAL_COMMUNITY): Payer: Medicare HMO

## 2014-07-21 DIAGNOSIS — I480 Paroxysmal atrial fibrillation: Secondary | ICD-10-CM

## 2014-07-21 DIAGNOSIS — R778 Other specified abnormalities of plasma proteins: Secondary | ICD-10-CM | POA: Insufficient documentation

## 2014-07-21 DIAGNOSIS — R7989 Other specified abnormal findings of blood chemistry: Secondary | ICD-10-CM | POA: Insufficient documentation

## 2014-07-21 DIAGNOSIS — R0602 Shortness of breath: Secondary | ICD-10-CM | POA: Insufficient documentation

## 2014-07-21 DIAGNOSIS — I4891 Unspecified atrial fibrillation: Secondary | ICD-10-CM

## 2014-07-21 LAB — HIV ANTIBODY (ROUTINE TESTING W REFLEX): HIV Screen 4th Generation wRfx: NONREACTIVE

## 2014-07-21 LAB — BASIC METABOLIC PANEL
Anion gap: 7 (ref 5–15)
BUN: 42 mg/dL — ABNORMAL HIGH (ref 6–23)
CALCIUM: 8.7 mg/dL (ref 8.4–10.5)
CO2: 34 mmol/L — ABNORMAL HIGH (ref 19–32)
CREATININE: 1.32 mg/dL (ref 0.50–1.35)
Chloride: 98 mmol/L (ref 96–112)
GFR, EST AFRICAN AMERICAN: 53 mL/min — AB (ref >90)
GFR, EST NON AFRICAN AMERICAN: 46 mL/min — AB (ref >90)
Glucose, Bld: 112 mg/dL — ABNORMAL HIGH (ref 70–99)
POTASSIUM: 4.1 mmol/L (ref 3.5–5.1)
SODIUM: 139 mmol/L (ref 135–145)

## 2014-07-21 LAB — STREP PNEUMONIAE URINARY ANTIGEN: STREP PNEUMO URINARY ANTIGEN: NEGATIVE

## 2014-07-21 MED ORDER — LORAZEPAM 2 MG/ML IJ SOLN
0.2500 mg | Freq: Once | INTRAMUSCULAR | Status: AC
  Administered 2014-07-21: 0.25 mg via INTRAVENOUS
  Filled 2014-07-21: qty 1

## 2014-07-21 MED ORDER — IPRATROPIUM-ALBUTEROL 0.5-2.5 (3) MG/3ML IN SOLN
3.0000 mL | RESPIRATORY_TRACT | Status: DC
  Administered 2014-07-21 – 2014-07-22 (×10): 3 mL via RESPIRATORY_TRACT
  Filled 2014-07-21 (×9): qty 3

## 2014-07-21 NOTE — Progress Notes (Signed)
[facility]                                                                              Patient Demographics  [name], is a 81 y.o. male, DOB - 09/26/1923, EXB:[id number]  Admit date - 07/20/2014   Admitting Physician Edsel Petrin, DO  Outpatient Primary MD for the patient is Kirk Ruths, MD  LOS - 1   Chief Complaint  Patient presents with  . Shortness of Breath      HPI on 07/20/2014 [name] is a 81 y.o. male with a history of coronary artery disease, hypertension, paroxysmal atrial fibrillation that presented to the emergency department with complaints of shortness of breath. Daughter at bedside. Patient does have a history of dementia. Neither the patient nor his daughter underwent his shortness of breath and cough started. Currently it is nonproductive. Patient denies any chest pain at this time. Patient does complain of hip pain but cannot be more specific. In the ER, CXR showed possible pneumonia with right pleural effusion. Patient currently afebrile, no leukocytosis. He was found to be in Atrial fibrillation, elevated troponin 0.09 and given cardizem. [facility] asked to admit.   Assessment & Plan  Acute congestive heart failure exacerbation, ?diastolic  -Patient will be admitted to telemetry -Last echocardiogram in May 2013- showed a probably normal systolic function -Echocardiogram in February 2006 showed an EF of 55-65% -Echocardiogram 07/21/14: EF 55-60%, LVOT thickened and calcified  -CXR: Small-to-moderate right-sided pleural effusion -BNP 467 -Continue Lasix 40 mg IV twice a day -Monitor daily weights, intake and output; total UOP over past 24 hours 1625cc (however, patient has had some incontinence) -Continue lisinopril, metoprolol, statin, aspirin  Community-acquired pneumonia -CXR: Right basilar airspace opacification, may reflect pneumonia -repeat CXR 4/24: worsening aeration, combination of effusion, atelectasis, pulm edema; cannot exclude  pneumonia -Continue Levaquin -Pending urine strep pneumonia and legionella antigens, sputum culture and Gram   Dyspnea -Likely multifactorial including possible CHF exacerbation versus pneumonia -Continue supplemental oxygen to maintain saturations above 92% -Continue DuoNeb treatments as needed -Treatment and plan as above  Paroxysmal atrial fibrillation -Likely secondary to the above processes -Patient is not on any anticoagulation due to fall risk -Continue home medications of diltiazem and metoprolol  Mildly elevated troponin -Likely secondary to multifactorial causes including demand ischemia from a proximal atrial fibrillation versus congestive heart failure -Trop 0.09 at admission, currently 0.07, no chest pain -Patient currently chest pain-free  History of coronary artery disease -Patient is currently chest pain-free -Troponins trending downward -Continue aspirin, lisinopril, metoprolol, statin  Hyperlipidemia -Continue statin  Arthritis/DJD -Continue home pain regimen  Hypertension -Continue metoprolol and lisinopril, IV Lasix  Dementia -Patient currently agitated, will give small dose of ativan and monitor closely   Code Status: DNR  Family Communication: Daughter at bedside  Disposition Plan: Admitted, continue diuresis  Time Spent in minutes   30 minutes  Procedures  Echocardiogram  Consults   None  DVT Prophylaxis  Heparin  Lab Results  Component Value Date   PLT 161 07/20/2014    Medications  Scheduled Meds: . antiseptic oral rinse  7 mL Mouth Rinse BID  . aspirin EC  81 mg Oral Daily  .  brimonidine  1 drop Right Eye q1800   And  . timolol  1 drop Right Eye q1800  . diltiazem  120 mg Oral Daily  . furosemide  40 mg Intravenous BID  . heparin  5,000 Units Subcutaneous 3 times per day  . ipratropium-albuterol  3 mL Nebulization Q4H WA  . latanoprost  1 drop Right Eye QHS  . levofloxacin (LEVAQUIN) IV  750 mg Intravenous Q48H  .  lisinopril  20 mg Oral Daily  . metoprolol  50 mg Oral BID  . potassium chloride  20 mEq Oral BID  . simvastatin  20 mg Oral q1800  . sodium chloride  3 mL Intravenous Q12H  . traZODone  50 mg Oral QHS   Continuous Infusions:  PRN Meds:.sodium chloride, acetaminophen, ondansetron (ZOFRAN) IV, oxyCODONE-acetaminophen, sodium chloride  Antibiotics    Anti-infectives    Start     Dose/Rate Route Frequency Ordered Stop   07/21/14 1800  levofloxacin (LEVAQUIN) IVPB 750 mg     750 mg 100 mL/hr over 90 Minutes Intravenous Every 48 hours 07/20/14 0903     07/20/14 0900  levofloxacin (LEVAQUIN) IVPB 750 mg  Status:  Discontinued     750 mg 100 mL/hr over 90 Minutes Intravenous Every 24 hours 07/20/14 0858 07/20/14 0903   07/20/14 0645  levofloxacin (LEVAQUIN) IVPB 750 mg     750 mg 100 mL/hr over 90 Minutes Intravenous  Once 07/20/14 0631 07/20/14 0815        Subjective:   [name] seen and examined today.  Patient states he feels his breathing has improved.  Denies further cough at this time.  Denies chest pain, abdominal pain, N/V/C/D.  Inquires about going home.  Daughter inquires about diet at home.   Objective:   Filed Vitals:   07/20/14 1925 07/20/14 2114 07/21/14 0608 07/21/14 0725  BP:  114/55 111/73   Pulse:  107 115   Temp:  98.4 F (36.9 C) 98.6 F (37 C)   TempSrc:  Oral Oral   Resp:  20 20   Height:      Weight:   57.4 kg (126 lb 8.7 oz)   SpO2: 97% 94% 94% 86%    Wt Readings from Last 3 Encounters:  07/21/14 57.4 kg (126 lb 8.7 oz)  10/02/12 65.772 kg (145 lb)  07/27/12 68.04 kg (150 lb)     Intake/Output Summary (Last 24 hours) at 07/21/14 1241 Last data filed at 07/20/14 1700  Gross per 24 hour  Intake      0 ml  Output    800 ml  Net   -800 ml    Exam  General: Well developed,  no distress  HEENT: NCAT, mucous membranes moist.   Cardiovascular: S1 S2 auscultated, 2/6 SEM, irregular  Respiratory: Diminished on the right  Abdomen:  Soft, nontender, nondistended, + bowel sounds  Extremities: warm dry without cyanosis clubbing or edema  Neuro: AAOx2, nonfocal  Psych: Appropriate mood and affect  Data Review   Micro Results No results found for this or any previous visit (from the past 240 hour(s)).  Radiology Reports Dg Chest Port 1 View  07/21/2014   CLINICAL DATA:  Short of breath.  Acute onset shortness of breath.  EXAM: PORTABLE CHEST - 1 VIEW  COMPARISON:  07/20/2014.  FINDINGS: Pulmonary aeration has worsened compared to yesterday's exam. There is now all density at the LEFT lung base along with the density at the RIGHT lung base, probably representing combination of edema  and atelectasis. Dense aortic atherosclerosis. CABG/ median sternotomy. Old granulomatous disease in the RIGHT upper lobe. Bilateral right-greater-than-left pleural effusions. Monitoring leads project over the chest. Severe LEFT glenohumeral osteoarthritis.  IMPRESSION: Worsening aeration likely representing a combination of effusion, atelectasis and pulmonary edema. Superimposed pneumonia is considered less likely but cannot be excluded. Aeration is worse on the RIGHT compared to the LEFT.   Electronically Signed   By: Andreas Newport M.D.   On: 07/21/2014 11:50   Dg Chest Port 1 View  07/20/2014   CLINICAL DATA:  Acute onset of shortness of breath, wheezing and cough. Initial encounter.  EXAM: PORTABLE CHEST - 1 VIEW  COMPARISON:  Chest radiograph from 09/23/2013  FINDINGS: The lungs are well-aerated. A small to moderate right-sided pleural effusion is noted, with right basilar airspace opacification. This may reflect pneumonia or asymmetric pulmonary edema. No pneumothorax is seen.  The cardiomediastinal silhouette is borderline normal in size. The patient is status post median sternotomy. No acute osseous abnormalities are seen. A right shoulder hemiarthroplasty is grossly unremarkable in appearance.  IMPRESSION: Small to moderate right-sided  pleural effusion, with right basilar airspace opacification. This may reflect pneumonia or asymmetric pulmonary edema.   Electronically Signed   By: Roanna Raider M.D.   On: 07/20/2014 05:25    CBC  Recent Labs Lab 07/20/14 0523  WBC 5.9  HGB 11.4*  HCT 37.0*  PLT 161  MCV 98.7  MCH 30.4  MCHC 30.8  RDW 15.0  LYMPHSABS 1.9  MONOABS 0.5  EOSABS 0.1  BASOSABS 0.0    Chemistries   Recent Labs Lab 07/20/14 0523 07/20/14 0942 07/21/14 0556  NA 140  --  139  K 3.7  --  4.1  CL 100  --  98  CO2 30  --  34*  GLUCOSE 89  --  112*  BUN 29*  --  42*  CREATININE 1.12  --  1.32  CALCIUM 9.0  --  8.7  MG  --  1.8  --   AST 62*  --   --   ALT 26  --   --   ALKPHOS 62  --   --   BILITOT 0.6  --   --    ------------------------------------------------------------------------------------------------------------------ estimated creatinine clearance is 30.8 mL/min (by C-G formula based on Cr of 1.32). ------------------------------------------------------------------------------------------------------------------ No results for input(s): HGBA1C in the last 72 hours. ------------------------------------------------------------------------------------------------------------------ No results for input(s): CHOL, HDL, LDLCALC, TRIG, CHOLHDL, LDLDIRECT in the last 72 hours. ------------------------------------------------------------------------------------------------------------------ No results for input(s): TSH, T4TOTAL, T3FREE, THYROIDAB in the last 72 hours.  Invalid input(s): FREET3 ------------------------------------------------------------------------------------------------------------------ No results for input(s): VITAMINB12, FOLATE, FERRITIN, TIBC, IRON, RETICCTPCT in the last 72 hours.  Coagulation profile No results for input(s): INR, PROTIME in the last 168 hours.  No results for input(s): DDIMER in the last 72 hours.  Cardiac Enzymes  Recent Labs Lab  07/20/14 0942 07/20/14 1515 07/20/14 2159  TROPONINI 0.08* 0.08* 0.07*   ------------------------------------------------------------------------------------------------------------------ Invalid input(s): POCBNP    Chimere Klingensmith D.O. on 07/21/2014 at 12:41 PM  Between 7am to 7pm - Pager - [phone]  After 7pm go to [email or web address] - password [email or web address]  And look for the night coverage person covering for me after hours  [facility] Office  [phone]

## 2014-07-22 ENCOUNTER — Encounter (HOSPITAL_COMMUNITY): Payer: Self-pay | Admitting: Cardiology

## 2014-07-22 DIAGNOSIS — I2510 Atherosclerotic heart disease of native coronary artery without angina pectoris: Secondary | ICD-10-CM

## 2014-07-22 DIAGNOSIS — I481 Persistent atrial fibrillation: Secondary | ICD-10-CM

## 2014-07-22 DIAGNOSIS — I5033 Acute on chronic diastolic (congestive) heart failure: Principal | ICD-10-CM

## 2014-07-22 LAB — CBC
HCT: 34.8 % — ABNORMAL LOW (ref 39.0–52.0)
Hemoglobin: 11 g/dL — ABNORMAL LOW (ref 13.0–17.0)
MCH: 30.6 pg (ref 26.0–34.0)
MCHC: 31.6 g/dL (ref 30.0–36.0)
MCV: 96.7 fL (ref 78.0–100.0)
Platelets: 161 10*3/uL (ref 150–400)
RBC: 3.6 MIL/uL — AB (ref 4.22–5.81)
RDW: 15.3 % (ref 11.5–15.5)
WBC: 8.6 10*3/uL (ref 4.0–10.5)

## 2014-07-22 LAB — LEGIONELLA ANTIGEN, URINE

## 2014-07-22 LAB — BASIC METABOLIC PANEL
Anion gap: 6 (ref 5–15)
BUN: 41 mg/dL — ABNORMAL HIGH (ref 6–23)
CHLORIDE: 97 mmol/L (ref 96–112)
CO2: 35 mmol/L — ABNORMAL HIGH (ref 19–32)
Calcium: 8.6 mg/dL (ref 8.4–10.5)
Creatinine, Ser: 1.26 mg/dL (ref 0.50–1.35)
GFR calc Af Amer: 57 mL/min — ABNORMAL LOW (ref >90)
GFR calc non Af Amer: 49 mL/min — ABNORMAL LOW (ref >90)
GLUCOSE: 92 mg/dL (ref 70–99)
POTASSIUM: 4.2 mmol/L (ref 3.5–5.1)
Sodium: 138 mmol/L (ref 135–145)

## 2014-07-22 NOTE — Evaluation (Signed)
Physical Therapy Evaluation Patient Details Name: [name] MRN: [id number] DOB: [birth year] Today's Date: 07/22/2014   History of Present Illness  With a history of coronary artery disease, hypertension, paroxysmal atrial fibrillation that presented to the emergency department with complaints of shortness of breath. Daughter at bedside. Patient does have a history of dementia. Neither the patient nor his daughter underwent his shortness of breath and cough started. Currently it is nonproductive. Patient denies any chest pain at this time. Patient does complain of hip pain but cannot be more specific. In the ER, CXR showed possible pneumonia with right pleural effusion. Patient currently afebrile, no leukocytosis. He was found to be in Atrial fibrillation, elevated troponin 0.09 and given cardizem. TRH asked to admit.   Clinical Impression  Pt is seen for evaluation and found to be alert and cooperative.  Wife and daughter were present.  Wife is interested in having HHPT to see if pt can get stronger at home.  Pt is oriented to self and family but no other parameters.  He is able to follow directions.  He is found to have generalized weakness and severe DJD in spine and hips (there is bone on bone grinding felt with all mobility and pt does describe pain).  Pt was able to transfer with min to mod assist and ambulate 15' with a walker and min assist.  He had no dyspnea with mobility.  I do think is appropriate to at least give HHPT a try at increasing pt's overall strength and mobility.  Wife would also appreciate home health aide service as well.    Follow Up Recommendations Home health PT (wife would also like [place] aide service)    Equipment Recommendations  None recommended by PT    Recommendations for Other Services    none   Precautions / Restrictions Precautions Precautions: Fall Restrictions Weight Bearing Restrictions: No      Mobility  Bed Mobility Overal bed mobility: Needs  Assistance Bed Mobility: Supine to Sit     Supine to sit: Min assist     General bed mobility comments: needs cues as to how to transfer  Transfers Overall transfer level: Needs assistance Equipment used: Rolling walker (2 wheeled) Transfers: Sit to/from Stand Sit to Stand: Mod assist         General transfer comment: pt is very arthritic in spine and LEs which makes transfer very slow and labored but once standing he is fairly stable with the walker.  Ambulation/Gait Ambulation/Gait assistance: Min assist Ambulation Distance (Feet): 15 Feet Assistive device: Rolling walker (2 wheeled) Gait Pattern/deviations: Trunk flexed;Narrow base of support   Gait velocity interpretation: Below normal speed for age/gender    Stairs            Wheelchair Mobility    Modified Rankin (Stroke Patients Only)       Balance Overall balance assessment: Needs assistance Sitting-balance support: No upper extremity supported;Feet supported Sitting balance-Leahy Scale: Fair     Standing balance support: Bilateral upper extremity supported Standing balance-Leahy Scale: Fair                               Pertinent Vitals/Pain Pain Assessment: No/denies pain (at rest)    Home Living Family/patient expects to be discharged to:: Private residence Living Arrangements: Spouse/significant other;Children Available Help at Discharge: Family;Available 24 hours/day Type of Home: House Home Access: Stairs to enter Entrance Stairs-Rails: Right Entrance Stairs-Number of Steps:  2 Home Layout: One level Home Equipment: Walker - 2 wheels;Cane - quad      Prior Function Level of Independence: Needs assistance   Gait / Transfers Assistance Needed: pt ambulates very short distances with walker and assist or with person assist  ADL's / Homemaking Assistance Needed: total assist with ADLs        Hand Dominance        Extremity/Trunk Assessment   Upper Extremity  Assessment: Generalized weakness           Lower Extremity Assessment: Generalized weakness (generalized joint stiffness, especially hips)      Cervical / Trunk Assessment: Kyphotic;Other exceptions  Communication      Cognition Arousal/Alertness: Awake/alert Behavior During Therapy: WFL for tasks assessed/performed Overall Cognitive Status: History of cognitive impairments - at baseline                      General Comments      Exercises        Assessment/Plan    PT Assessment All further PT needs can be met in the next venue of care  PT Diagnosis Difficulty walking;Generalized weakness   PT Problem List Decreased strength;Decreased activity tolerance;Decreased mobility;Cardiopulmonary status limiting activity  PT Treatment Interventions     PT Goals (Current goals can be found in the Care Plan section) Acute Rehab PT Goals PT Goal Formulation: All assessment and education complete, DC therapy    Frequency     Barriers to discharge        Co-evaluation               End of Session Equipment Utilized During Treatment: Gait belt Activity Tolerance: Patient tolerated treatment well Patient left: in chair;with call bell/phone within reach;with chair alarm set;with family/visitor present Nurse Communication: Mobility status         Time: 6546-5035 PT Time Calculation (min) (ACUTE ONLY): 58 min   Charges:   PT Evaluation $Initial PT Evaluation Tier I: 1 Procedure     PT G CodesSable Feil 07/22/2014, 4:37 PM

## 2014-07-22 NOTE — Progress Notes (Addendum)
[facility]                                                                              Patient Demographics  [name], is a 81 y.o. male, DOB - Jul 15, 1923, ZOX:[id number]  Admit date - 07/20/2014   Admitting Physician Edsel Petrin, DO  Outpatient Primary MD for the patient is Kirk Ruths, MD  LOS - 2   Chief Complaint  Patient presents with  . Shortness of Breath      HPI on 07/20/2014 [name] is a 81 y.o. male with a history of coronary artery disease, hypertension, paroxysmal atrial fibrillation that presented to the emergency department with complaints of shortness of breath. Daughter at bedside. Patient does have a history of dementia. Neither the patient nor his daughter underwent his shortness of breath and cough started. Currently it is nonproductive. Patient denies any chest pain at this time. Patient does complain of hip pain but cannot be more specific. In the ER, CXR showed possible pneumonia with right pleural effusion. Patient currently afebrile, no leukocytosis. He was found to be in Atrial fibrillation, elevated troponin 0.09 and given cardizem. TRH asked to admit.   Assessment & Plan  Acute congestive heart failure exacerbation, diastolic -Patient will be admitted to telemetry -Last echocardiogram in May 2013- showed a probably normal systolic function -Echocardiogram in February 2006 showed an EF of 55-65% -Echocardiogram 07/21/14: EF 55-60%, LVOT thickened and calcified  -CXR: Small-to-moderate right-sided pleural effusion -BNP 467 -Continue Lasix 40 mg IV twice a day -Monitor daily weights, intake and output; total UOP over past 24 hours 1400cc  -Continue lisinopril, metoprolol, statin, aspirin -Cardiology consulted and appreciated -Had lengthy conversation regarding proper diet and fluid intake  Community-acquired pneumonia -CXR: Right basilar airspace opacification, may reflect pneumonia -repeat CXR 4/24: worsening aeration,  combination of effusion, atelectasis, pulm edema; cannot exclude pneumonia -Continue Levaquin -Pneumonia urine antigen negative -Pending legionella urine Ag -No sputum sample for culture or stain  Dyspnea -Likely multifactorial including possible CHF exacerbation versus pneumonia vs afib -Continue supplemental oxygen to maintain saturations above 92% -Continue DuoNeb treatments as needed -Treatment and plan as above -Attempted to wean off of [place], however difficult to obtain O2 sats due to cold extremities (chronic problem per family) -Will obtain continuous pulse ox  Paroxysmal atrial fibrillation -Likely secondary to the above processes -Patient is not on any anticoagulation due to fall risk and dementia -Continue home medications of diltiazem and metoprolol -Per cardiology, repeat EKG in am  Mildly elevated troponin/demand ischemia -Likely secondary to multifactorial causes including demand ischemia from a proximal atrial fibrillation versus congestive heart failure -Trop 0.09 at admission, currently 0.07, no chest pain -Patient currently chest pain-free  History of coronary artery disease -Patient is currently chest pain-free -Troponins trending downward -Continue aspirin, lisinopril, metoprolol, statin  Hyperlipidemia -Continue statin  Arthritis/DJD -Continue home pain regimen  Hypertension -Continue metoprolol and lisinopril, IV Lasix  Dementia  Code Status: DNR  Family Communication: Daughters at bedside  Disposition Plan: Admitted, continue diuresis. Cardiology consulted for further management.  Time Spent in minutes   30 minutes  Procedures  Echocardiogram  Consults   Cardiology  DVT Prophylaxis  Heparin  Lab Results  Component Value Date   PLT 161 07/22/2014    Medications  Scheduled Meds: . antiseptic oral rinse  7 mL Mouth Rinse BID  . aspirin EC  81 mg Oral Daily  . brimonidine  1 drop Right Eye q1800   And  . timolol  1 drop Right Eye  q1800  . diltiazem  120 mg Oral Daily  . furosemide  40 mg Intravenous BID  . heparin  5,000 Units Subcutaneous 3 times per day  . ipratropium-albuterol  3 mL Nebulization Q4H WA  . latanoprost  1 drop Right Eye QHS  . levofloxacin (LEVAQUIN) IV  750 mg Intravenous Q48H  . lisinopril  20 mg Oral Daily  . metoprolol  50 mg Oral BID  . potassium chloride  20 mEq Oral BID  . simvastatin  20 mg Oral q1800  . sodium chloride  3 mL Intravenous Q12H  . traZODone  50 mg Oral QHS   Continuous Infusions:  PRN Meds:.sodium chloride, acetaminophen, ondansetron (ZOFRAN) IV, oxyCODONE-acetaminophen, sodium chloride  Antibiotics    Anti-infectives    Start     Dose/Rate Route Frequency Ordered Stop   07/21/14 1800  levofloxacin (LEVAQUIN) IVPB 750 mg     750 mg 100 mL/hr over 90 Minutes Intravenous Every 48 hours 07/20/14 0903     07/20/14 0900  levofloxacin (LEVAQUIN) IVPB 750 mg  Status:  Discontinued     750 mg 100 mL/hr over 90 Minutes Intravenous Every 24 hours 07/20/14 0858 07/20/14 0903   07/20/14 0645  levofloxacin (LEVAQUIN) IVPB 750 mg     750 mg 100 mL/hr over 90 Minutes Intravenous  Once 07/20/14 0631 07/20/14 0815        Subjective:   [name] seen and examined today.  Patient states he feels his breathing has improved and denies productive cough.  Per family, patient has dry cough from occasionally. Patient also snores when he falls asleep.  Denies chest pain, abdominal pain, N/V/C/D.   Objective:   Filed Vitals:   07/21/14 1600 07/21/14 2117 07/22/14 0543 07/22/14 1111  BP:  101/80 129/64   Pulse:  81    Temp:  98.2 F (36.8 C) 97.9 F (36.6 C)   TempSrc:  Oral Oral   Resp:  20 20   Height:      Weight:   57.9 kg (127 lb 10.3 oz)   SpO2: 99%  88% 99%    Wt Readings from Last 3 Encounters:  07/22/14 57.9 kg (127 lb 10.3 oz)  10/02/12 65.772 kg (145 lb)  07/27/12 68.04 kg (150 lb)     Intake/Output Summary (Last 24 hours) at 07/22/14 1210 Last data  filed at 07/22/14 0920  Gross per 24 hour  Intake    393 ml  Output   1400 ml  Net  -1007 ml    Exam  General: Well developed,  no distress  HEENT: NCAT, mucous membranes moist.   Cardiovascular: S1 S2 auscultated, 2/6 SEM, irregular  Respiratory: Diminished  Breaths sounds- particularly at the bases with few crackles  Abdomen: Soft, nontender, nondistended, + bowel sounds  Extremities: warm dry without cyanosis clubbing.  Trace edema LE B/L  Data Review   Micro Results Recent Results (from the past 240 hour(s))  Culture, blood (routine x 2) Call MD if unable to obtain prior to antibiotics being given     Status: None (Preliminary result)   Collection Time: 07/20/14  9:42 AM  Result Value Ref Range Status  Specimen Description LEFT ANTECUBITAL  Final   Special Requests BOTTLES DRAWN AEROBIC AND ANAEROBIC 6CC  Final   Culture NO GROWTH 2 DAYS  Final   Report Status PENDING  Incomplete  Culture, blood (routine x 2) Call MD if unable to obtain prior to antibiotics being given     Status: None (Preliminary result)   Collection Time: 07/20/14  9:45 AM  Result Value Ref Range Status   Specimen Description RIGHT ANTECUBITAL  Final   Special Requests BOTTLES DRAWN AEROBIC ONLY 6CC  Final   Culture NO GROWTH 2 DAYS  Final   Report Status PENDING  Incomplete    Radiology Reports Dg Chest Port 1 View  07/21/2014   CLINICAL DATA:  Short of breath.  Acute onset shortness of breath.  EXAM: PORTABLE CHEST - 1 VIEW  COMPARISON:  07/20/2014.  FINDINGS: Pulmonary aeration has worsened compared to yesterday's exam. There is now all density at the LEFT lung base along with the density at the RIGHT lung base, probably representing combination of edema and atelectasis. Dense aortic atherosclerosis. CABG/ median sternotomy. Old granulomatous disease in the RIGHT upper lobe. Bilateral right-greater-than-left pleural effusions. Monitoring leads project over the chest. Severe LEFT glenohumeral  osteoarthritis.  IMPRESSION: Worsening aeration likely representing a combination of effusion, atelectasis and pulmonary edema. Superimposed pneumonia is considered less likely but cannot be excluded. Aeration is worse on the RIGHT compared to the LEFT.   Electronically Signed   By: Andreas Newport M.D.   On: 07/21/2014 11:50   Dg Chest Port 1 View  07/20/2014   CLINICAL DATA:  Acute onset of shortness of breath, wheezing and cough. Initial encounter.  EXAM: PORTABLE CHEST - 1 VIEW  COMPARISON:  Chest radiograph from 09/23/2013  FINDINGS: The lungs are well-aerated. A small to moderate right-sided pleural effusion is noted, with right basilar airspace opacification. This may reflect pneumonia or asymmetric pulmonary edema. No pneumothorax is seen.  The cardiomediastinal silhouette is borderline normal in size. The patient is status post median sternotomy. No acute osseous abnormalities are seen. A right shoulder hemiarthroplasty is grossly unremarkable in appearance.  IMPRESSION: Small to moderate right-sided pleural effusion, with right basilar airspace opacification. This may reflect pneumonia or asymmetric pulmonary edema.   Electronically Signed   By: Roanna Raider M.D.   On: 07/20/2014 05:25    CBC  Recent Labs Lab 07/20/14 0523 07/22/14 0631  WBC 5.9 8.6  HGB 11.4* 11.0*  HCT 37.0* 34.8*  PLT 161 161  MCV 98.7 96.7  MCH 30.4 30.6  MCHC 30.8 31.6  RDW 15.0 15.3  LYMPHSABS 1.9  --   MONOABS 0.5  --   EOSABS 0.1  --   BASOSABS 0.0  --     Chemistries   Recent Labs Lab 07/20/14 0523 07/20/14 0942 07/21/14 0556 07/22/14 0631  NA 140  --  139 138  K 3.7  --  4.1 4.2  CL 100  --  98 97  CO2 30  --  34* 35*  GLUCOSE 89  --  112* 92  BUN 29*  --  42* 41*  CREATININE 1.12  --  1.32 1.26  CALCIUM 9.0  --  8.7 8.6  MG  --  1.8  --   --   AST 62*  --   --   --   ALT 26  --   --   --   ALKPHOS 62  --   --   --   BILITOT 0.6  --   --   --     ------------------------------------------------------------------------------------------------------------------  estimated creatinine clearance is 32.5 mL/min (by C-G formula based on Cr of 1.26). ------------------------------------------------------------------------------------------------------------------ No results for input(s): HGBA1C in the last 72 hours. ------------------------------------------------------------------------------------------------------------------ No results for input(s): CHOL, HDL, LDLCALC, TRIG, CHOLHDL, LDLDIRECT in the last 72 hours. ------------------------------------------------------------------------------------------------------------------ No results for input(s): TSH, T4TOTAL, T3FREE, THYROIDAB in the last 72 hours.  Invalid input(s): FREET3 ------------------------------------------------------------------------------------------------------------------ No results for input(s): VITAMINB12, FOLATE, FERRITIN, TIBC, IRON, RETICCTPCT in the last 72 hours.  Coagulation profile No results for input(s): INR, PROTIME in the last 168 hours.  No results for input(s): DDIMER in the last 72 hours.  Cardiac Enzymes  Recent Labs Lab 07/20/14 0942 07/20/14 1515 07/20/14 2159  TROPONINI 0.08* 0.08* 0.07*   ------------------------------------------------------------------------------------------------------------------ Invalid input(s): POCBNP    Hayly Litsey D.O. on 07/22/2014 at 12:10 PM  Between 7am to 7pm - Pager - [phone]  After 7pm go to [email or web address] - password [email or web address]  And look for the night coverage person covering for me after hours  [facility] Office  [phone]

## 2014-07-22 NOTE — Consult Note (Signed)
Primary cardiologist: Dr. Jonelle SidleSamuel G. Kassondra Geil Consulting cardiologist: Dr. Jonelle SidleSamuel G. Aritza Brunet  Reason for consultation: Shortness of breath  Clinical Summary [name] is an 81 y.o.male with past medical history outlined below, last seen in our office in July 2014 by Ms. Lawrence NP. He is currently admitted to the hospital with reported worsening shortness of breath, history is fairly limited due to his dementia. I discussed his recent symptoms with a daughter by phone, also 3 other family members in the room showing concern in his overall status. He does have evidence of volume overload with pulmonary edema by chest x-ray (cannot completely exclude combination of effusion or infection). I reviewed his outpatient regimen, unclear about compliance, particularly with sodium restriction and diuretic use. He is noted to be in coarse atrial fibrillation, initially with RVR by ECG, this could also be contributing to diastolic heart failure. Follow-up echocardiogram is reviewed below showing LVEF 55-60% range, mild mitral regurgitation, also small pericardial effusion.   [name] does not provide any specific information today, is calm, wearing protective hand mitts. Recent vital signs reviewed, blood pressure has been well-controlled, heart rate in the 80s. He is diuresing on IV Lasix, nearly 2 L out at this point.   No Known Allergies  Medications Scheduled Medications: . antiseptic oral rinse  7 mL Mouth Rinse BID  . aspirin EC  81 mg Oral Daily  . brimonidine  1 drop Right Eye q1800   And  . timolol  1 drop Right Eye q1800  . diltiazem  120 mg Oral Daily  . furosemide  40 mg Intravenous BID  . heparin  5,000 Units Subcutaneous 3 times per day  . ipratropium-albuterol  3 mL Nebulization Q4H WA  . latanoprost  1 drop Right Eye QHS  . levofloxacin (LEVAQUIN) IV  750 mg Intravenous Q48H  . lisinopril  20 mg Oral Daily  . metoprolol  50 mg Oral BID  . potassium chloride  20 mEq Oral BID  .  simvastatin  20 mg Oral q1800  . sodium chloride  3 mL Intravenous Q12H  . traZODone  50 mg Oral QHS    PRN Medications: sodium chloride, acetaminophen, ondansetron (ZOFRAN) IV, oxyCODONE-acetaminophen, sodium chloride   Past Medical History  Diagnosis Date  . CAD (coronary artery disease)     CABG 03/2001  . Hyperlipidemia   . DJD (degenerative joint disease)     Neck, shoulder and knees  . BPH (benign prostatic hypertrophy)   . Carotid artery disease   . PAF (paroxysmal atrial fibrillation)     Postoperative  . Essential hypertension   . Tobacco abuse, in remission     Discontinued in 1990  . Paroxysmal atrial flutter   . Dementia     Past Surgical History  Procedure Laterality Date  . Cataract extraction w/ intraocular lens implant Right   . Coronary artery bypass graft  2003  . Shoulder hemi-arthroplasty Right     Dr. Romeo AppleHarrison    Family History  Problem Relation Age of Onset  . Sudden death Father 8556    Social History Mr. Shuler reports that he has quit smoking. His smoking use included Cigarettes. He does not have any smokeless tobacco history on file. [name] reports that he does not drink alcohol.  Review of Systems Complete review of systems unable to be obtained due to patient's dementia and limited provided history.  Physical Examination Blood pressure 129/64, pulse 81, temperature 97.9 F (36.6 C), temperature source Oral,  resp. rate 20, height  (1.702 m), weight 127 lb 10.3 oz (57.9 kg), SpO2 88 %.  Intake/Output Summary (Last 24 hours) at 07/22/14 1111 Last data filed at 07/22/14 0920  Gross per 24 hour  Intake    633 ml  Output   1400 ml  Net   -767 ml   Elderly male, presently calm and in no distress. HEENT: Conjunctiva and lids normal, oropharynx clear. Neck: Supple, no elevated JVP, soft carotid bruits, no thyromegaly. Lungs: Crackles at the bases, nonlabored breathing at rest. Cardiac: Distant irregular rhythm, no S3, soft systolic  murmur, no pericardial rub. Abdomen: Soft, nontender, bowel sounds present, no guarding or rebound. Extremities: No pitting edema, distal pulses 1-2+. Skin: Warm and dry. Musculoskeletal: No kyphosis. Neuropsychiatric: Alert and oriented x1, calm.   Lab Results  Basic Metabolic Panel:  Recent Labs Lab 07/20/14 0523 07/20/14 0942 07/21/14 0556 07/22/14 0631  NA 140  --  139 138  K 3.7  --  4.1 4.2  CL 100  --  98 97  CO2 30  --  34* 35*  GLUCOSE 89  --  112* 92  BUN 29*  --  42* 41*  CREATININE 1.12  --  1.32 1.26  CALCIUM 9.0  --  8.7 8.6  MG  --  1.8  --   --     Liver Function Tests:  Recent Labs Lab 07/20/14 0523  AST 62*  ALT 26  ALKPHOS 62  BILITOT 0.6  PROT 6.8  ALBUMIN 3.2*    CBC:  Recent Labs Lab 07/20/14 0523 07/22/14 0631  WBC 5.9 8.6  NEUTROABS 3.4  --   HGB 11.4* 11.0*  HCT 37.0* 34.8*  MCV 98.7 96.7  PLT 161 161    Cardiac Enzymes:  Recent Labs Lab 07/20/14 0523 07/20/14 0942 07/20/14 1515 07/20/14 2159  TROPONINI 0.09* 0.08* 0.08* 0.07*    BNP: 467  ECG Course atrial fibrillation at 125 bpm with RBBB and LAFB.  Imaging  1. Echocardiogram 07/20/2014: Study Conclusions  - Left ventricle: The cavity size was normal. Wall thickness was normal. Systolic function was normal. The estimated ejection fraction was in the range of 55% to 60%. Wall motion was normal; there were no regional wall motion abnormalities. - Mitral valve: There was mild regurgitation. - Pericardium, extracardiac: A small pericardial effusion was identified posterior to the heart.  2. Chest x-ray 07/21/2014: FINDINGS: Pulmonary aeration has worsened compared to yesterday's exam. There is now all density at the LEFT lung base along with the density at the RIGHT lung base, probably representing combination of edema and atelectasis. Dense aortic atherosclerosis. CABG/ median sternotomy. Old granulomatous disease in the RIGHT upper lobe.  Bilateral right-greater-than-left pleural effusions. Monitoring leads project over the chest. Severe LEFT glenohumeral osteoarthritis.  IMPRESSION: Worsening aeration likely representing a combination of effusion, atelectasis and pulmonary edema. Superimposed pneumonia is considered less likely but cannot be excluded. Aeration is worse on the RIGHT compared to the LEFT.   Impression  1. Presentation with shortness of breath, suspect acute on chronic diastolic heart failure with evidence of volume overload in the setting of paroxysmal to persistent coarse atrial fibrillation with increased heart rates. Uncertain about compliance with sodium restriction and diuretics as an outpatient.  2. Paroxysmal to persistent atrial fibrillation, history of atrial flutter as well. CHADSVASC score is at least 4. He has been on aspirin, Cardizem, and Lopressor as an outpatient. Not an optimal candidate for anticoagulation with dementia and concern about increased  fall and bleeding risk.  3. Minor increase in troponin I, relatively flat pattern. Suspect demand ischemia in the setting of known ischemic heart disease, diastolic heart failure, and atrial arrhythmia.  4. CAD status post CABG in 2003.  5. Dementia.  Recommendations  Discussed situation with family members as outlined above. Agree with IV diuresis to obtain better control of volume status. He is on both Lopressor and Cardizem CD, follow-up ECG tomorrow to reassess heart rate control and rhythm - may need further adjustments. He is also on antibiotics per primary team for treatment of possible associated pneumonia. We will continue to follow with you.  Jonelle Sidle, M.D., F.A.C.C.

## 2014-07-22 NOTE — Progress Notes (Signed)
Pt on Room Air, continuous pulse ox hooked up with probe on forehead to obtain more accurate reading.  Pt SpO2 is 98-100% on room air.  Will continue to monitor.

## 2014-07-22 NOTE — Care Management Note (Addendum)
    Page 1 of 2   07/23/2014     12:15:28 PM CARE MANAGEMENT NOTE 07/23/2014  Patient:  [name],[name]   Account Number:  [id number]  Date Initiated:  07/22/2014  Documentation initiated by:  Kathyrn SheriffHILDRESS,JESSICA  Subjective/Objective Assessment:   Pt is from home, lives with wife and son. Pt has dementia and is wheelchiar bound, uses [facility] for toiletting. [facility]. [facility]'s [facility]. Pt's family reuqests HH PT.     Action/Plan:   Will have PT eval pt for appropriateness. Pt does not meet requirements for home O2. [facility] notified of [facility] and will obtain info from chart. Will cont to follow.   Anticipated DC Date:  07/23/2014   Anticipated DC Plan:  HOME W HOME HEALTH SERVICES      DC Planning Services  CM consult      [facility]   Choice offered to / List presented to:  C-4 Adult Children        HH arranged  HH-1 RN  HH-4 NURSE'S AIDE  HH-2 PT  HH-6 SOCIAL WORKER      HH agency  [facility]   Status of service:  Completed, signed off Medicare Important Message given?  YES (If response is "NO", the following Medicare IM given date fields will be blank) Date Medicare IM given:  07/23/2014 Medicare IM given by:  Kathyrn SheriffHILDRESS,JESSICA Date Additional Medicare IM given:   Additional Medicare IM given by:    Discharge Disposition:  HOME W HOME HEALTH SERVICES  Per UR Regulation:    If discussed at Long Length of Stay Meetings, dates discussed:    Comments:  07/23/2014 1200 Kathyrn SheriffJessica Childress, RN, MSN, CM Pt discharging home today. [facility] made aware of discharge. No further CM needs. 07/22/2014 1300 Kathyrn SheriffJessica Childress, RN, MSN, CM

## 2014-07-23 ENCOUNTER — Inpatient Hospital Stay (HOSPITAL_COMMUNITY): Payer: Medicare HMO

## 2014-07-23 DIAGNOSIS — E785 Hyperlipidemia, unspecified: Secondary | ICD-10-CM

## 2014-07-23 DIAGNOSIS — F0391 Unspecified dementia with behavioral disturbance: Secondary | ICD-10-CM

## 2014-07-23 DIAGNOSIS — J189 Pneumonia, unspecified organism: Secondary | ICD-10-CM

## 2014-07-23 LAB — BASIC METABOLIC PANEL
Anion gap: 10 (ref 5–15)
BUN: 38 mg/dL — AB (ref 6–23)
CHLORIDE: 96 mmol/L (ref 96–112)
CO2: 33 mmol/L — AB (ref 19–32)
Calcium: 9 mg/dL (ref 8.4–10.5)
Creatinine, Ser: 1.25 mg/dL (ref 0.50–1.35)
GFR, EST AFRICAN AMERICAN: 57 mL/min — AB (ref >90)
GFR, EST NON AFRICAN AMERICAN: 49 mL/min — AB (ref >90)
Glucose, Bld: 81 mg/dL (ref 70–99)
Potassium: 3.7 mmol/L (ref 3.5–5.1)
Sodium: 139 mmol/L (ref 135–145)

## 2014-07-23 MED ORDER — LORAZEPAM 0.5 MG PO TABS: 0.2500 mg | ORAL_TABLET | Freq: Every evening | ORAL | Status: AC | PRN

## 2014-07-23 MED ORDER — FUROSEMIDE 40 MG PO TABS
40.0000 mg | ORAL_TABLET | Freq: Every day | ORAL | Status: DC
  Administered 2014-07-23: 40 mg via ORAL
  Filled 2014-07-23: qty 1

## 2014-07-23 MED ORDER — DILTIAZEM HCL ER COATED BEADS 120 MG PO CP24: 120.0000 mg | ORAL_CAPSULE | Freq: Every day | ORAL | Status: AC

## 2014-07-23 MED ORDER — IPRATROPIUM-ALBUTEROL 0.5-2.5 (3) MG/3ML IN SOLN
3.0000 mL | Freq: Three times a day (TID) | RESPIRATORY_TRACT | Status: DC
  Administered 2014-07-23: 3 mL via RESPIRATORY_TRACT
  Filled 2014-07-23 (×2): qty 3

## 2014-07-23 MED ORDER — LORAZEPAM 0.5 MG PO TABS: 0.2500 mg | ORAL_TABLET | Freq: Four times a day (QID) | ORAL | Status: DC | PRN

## 2014-07-23 MED ORDER — FUROSEMIDE 40 MG PO TABS: 40.0000 mg | ORAL_TABLET | Freq: Every day | ORAL | Status: DC

## 2014-07-23 MED ORDER — LEVOFLOXACIN 750 MG PO TABS: 750.0000 mg | ORAL_TABLET | ORAL | Status: DC

## 2014-07-23 NOTE — Progress Notes (Signed)
Attempted to do morning EKG, patient would not let me scan his arm band or touch him without getting angry. RN notified.

## 2014-07-23 NOTE — Progress Notes (Signed)
Patient was moaning and had difficulty sitting up in bed due to arthritic pain in hips and back.  Percocet offered, patient refused, waited several minutes, would say he was going to take it, but then refuse when offered.   Daughter in room and tried to explain to patient that Percocet was what he takes at home for pain but patient continued to refuse.Percocet wasted

## 2014-07-23 NOTE — Progress Notes (Signed)
PT Cancellation Note  Patient Details Name: Jacob MornGeorge H Whitehead MRN: [id number] DOB: July 02, 1923   Cancelled Treatment:    Reason Eval/Treat Not Completed: Other (comment).  I have received another consult to see this patient.  He was seen yesterday and the note can be accessed in the chart.  Please refer to this note.   Myrlene BrokerBrown, Filippa Yarbough L 07/23/2014, 10:20 AM

## 2014-07-23 NOTE — Discharge Summary (Signed)
Physician Discharge Summary  PARMINDER CUPPLES NWG:[id number] DOB: 11/19/1923 DOA: 07/20/2014  PCP: Kirk Ruths, MD  Admit date: 07/20/2014 Discharge date: 07/23/2014  Time spent: 45 minutes  Recommendations for Outpatient Follow-up:  Patient will be discharged to home with home health, PT, social work, Charity fundraiser, Engineer, production.  Patient will need to follow up with primary care provider within one week of discharge.  Patient should continue medications as prescribed.  Patient should follow a heart healthy diet .   Discharge Diagnoses:  Acute congestive heart failure exacerbation, diastolic Community acquired pneumonia Dyspnea Paroxysmal atrial fibrillation Mildly elevated troponin/demand ischemia History of coronary artery disease Hyperlipidemia  Arthritis/DJD Hypertension Dementia with agitation Physical deconditioning  Discharge Condition: Stable  Diet recommendation: heart healthy diet   Filed Weights   07/21/14 0608 07/22/14 0543 07/23/14 0549  Weight: 57.4 kg (126 lb 8.7 oz) 57.9 kg (127 lb 10.3 oz) 58.4 kg (128 lb 12 oz)    History of present illness:  on 07/20/2014 [name] is a 81 y.o. male with a history of coronary artery disease, hypertension, paroxysmal atrial fibrillation that presented to the emergency department with complaints of shortness of breath. Daughter at bedside. Patient does have a history of dementia. Neither the patient nor his daughter underwent his shortness of breath and cough started. Currently it is nonproductive. Patient denies any chest pain at this time. Patient does complain of hip pain but cannot be more specific. In the ER, CXR showed possible pneumonia with right pleural effusion. Patient currently afebrile, no leukocytosis. He was found to be in Atrial fibrillation, elevated troponin 0.09 and given cardizem. [facility] asked to admit.   Hospital Course:  Acute congestive heart failure exacerbation, diastolic -Last echocardiogram in May 2013- showed a  probably normal systolic function -Echocardiogram in February 2006 showed an EF of 55-65% -Echocardiogram 07/21/14: EF 55-60%, LVOT thickened and calcified  -CXR: Small-to-moderate right-sided pleural effusion -BNP 467 -Initially placed on Lasix  IV BID, and diuresed ~5L during hospitalization  -Monitor daily weights, intake and output; total UOP over past 24 hours 1950cc  -Continue lisinopril, metoprolol, statin, aspirin -Cardiology consulted and appreciated -Had lengthy conversation regarding proper diet and fluid intake -Will discharge patient with Lasix  PO daily -Patient will need repeat BMP and follow up with cardiology  Community-acquired pneumonia -CXR: Right basilar airspace opacification, may reflect pneumonia -repeat CXR 4/24: worsening aeration, combination of effusion, atelectasis, pulm edema; cannot exclude pneumonia -Continue Levaquin -Strep Pneumonia and legionella urine antigens negative  Dyspnea -Likely multifactorial including possible CHF exacerbation versus pneumonia vs afib -Continue supplemental oxygen to maintain saturations above 92% -Continue DuoNeb treatments as needed -Treatment and plan as above -Patient weaned off of [place], continuous pulse ox shows 100% on room air  Paroxysmal atrial fibrillation -Likely secondary to the above processes -Patient is not on any anticoagulation due to fall risk and dementia -Continue home medications of diltiazem and metoprolol -Per cardiology, repeat EKG- however, patient would not allow for EKG -Spoke with Dr. Wyline Mood, no need to change regimen at this time, follow up outpatient   Mildly elevated troponin/demand ischemia -Likely secondary to multifactorial causes including demand ischemia from a proximal atrial fibrillation versus congestive heart failure -Trop 0.09 at admission, currently 0.07, no chest pain -Patient currently chest pain-free  History of coronary artery disease -Patient is currently chest  pain-free -Troponins trending downward -Continue aspirin, lisinopril, metoprolol, statin  Hyperlipidemia -Continue statin  Arthritis/DJD -Continue home pain regimen  Hypertension -Continue metoprolol and lisinopril, Lasix  Dementia with  agitation -Will discharge with small dose of Ativan QHS PRN -Spoke with multiple family members, and explained that this should be followed up by PCP  Physical deconditioning -Per family, patient has been sedentary and uses a wheelchair -PT consulted and recommended HH   Procedures  Echocardiogram  Consults  Cardiology  Discharge Exam: Filed Vitals:   07/23/14 1049  BP: 131/95  Pulse: 81  Temp: 97.4 F (36.3 C)  Resp: 20   Exam  General: Well developed, no distress  HEENT: NCAT, mucous membranes moist.   Cardiovascular: S1 S2 auscultated, 2/6 SEM, irregular  Respiratory: Diminished Breaths sounds- particularly at the bases with few crackles  Abdomen: Soft, nontender, nondistended, + bowel sounds  Extremities: warm dry without cyanosis clubbing. Trace edema LE B/L  Discharge Instructions      Discharge Instructions    Discharge instructions    Complete by:  As directed   Patient will be discharged to home with home health, PT, social work, Charity fundraiser, Engineer, production.  Patient will need to follow up with primary care provider within one week of discharge and have repeat BMP.  Patient will also need to follow up with Cardiology.  Patient should continue medications as prescribed.  Patient should follow a heart healthy diet .            Medication List    TAKE these medications        aspirin 81 MG tablet  Take 81 mg by mouth daily.     COMBIGAN 0.2-0.5 % ophthalmic solution  Generic drug:  brimonidine-timolol  Place 1 drop into the right eye daily at 6 PM.     diltiazem 120 MG 24 hr capsule  Commonly known as:  CARDIZEM CD  Take 1 capsule (120 mg total) by mouth daily.     furosemide 40 MG tablet  Commonly known as:   LASIX  Take 1 tablet (40 mg total) by mouth daily.     lisinopril-hydrochlorothiazide 20-12.5 MG per tablet  Commonly known as:  PRINZIDE,ZESTORETIC  Take 1 tablet by mouth daily.     LORazepam 0.5 MG tablet  Commonly known as:  ATIVAN  Take 0.5 tablets (0.25 mg total) by mouth at bedtime as needed for anxiety or sleep.     LUMIGAN 0.01 % Soln  Generic drug:  bimatoprost  Place 1 drop into the right eye at bedtime.     metoprolol 50 MG tablet  Commonly known as:  LOPRESSOR  Take 50 mg by mouth once.     oxyCODONE-acetaminophen 5-325 MG per tablet  Commonly known as:  PERCOCET/ROXICET  Take 1 tablet by mouth every 6 (six) hours as needed for severe pain.     simvastatin 20 MG tablet  Commonly known as:  ZOCOR  Take 1 tablet (20 mg total) by mouth daily.     traZODone 50 MG tablet  Commonly known as:  DESYREL  Take 50 mg by mouth at bedtime.       No Known Allergies Follow-up Information    Follow up with [facility].   Contact information:   [address] [place] [address] [phone]       Follow up with Joni Reining, NP On 08/06/2014.   Specialty:  Nurse Practitioner   Why:  2:30   Contact information:   [address] [place] [address] [phone]       Follow up with Kirk Ruths, MD. Schedule an appointment as soon as possible for a visit  in 1 week.   Specialty:  Family Medicine   Why:  Hospital follow up , repeat BMP   Contact information:   [address] [place] [id number] [id number] [phone]        The results of significant diagnostics from this hospitalization (including imaging, microbiology, ancillary and laboratory) are listed below for reference.    Significant Diagnostic Studies: Dg Chest Port 1 View  07/21/2014   CLINICAL DATA:  Short of breath.  Acute onset shortness of breath.  EXAM: PORTABLE CHEST - 1 VIEW  COMPARISON:  07/20/2014.  FINDINGS: Pulmonary aeration has worsened compared to  yesterday's exam. There is now all density at the LEFT lung base along with the density at the RIGHT lung base, probably representing combination of edema and atelectasis. Dense aortic atherosclerosis. CABG/ median sternotomy. Old granulomatous disease in the RIGHT upper lobe. Bilateral right-greater-than-left pleural effusions. Monitoring leads project over the chest. Severe LEFT glenohumeral osteoarthritis.  IMPRESSION: Worsening aeration likely representing a combination of effusion, atelectasis and pulmonary edema. Superimposed pneumonia is considered less likely but cannot be excluded. Aeration is worse on the RIGHT compared to the LEFT.   Electronically Signed   By: Andreas NewportGeoffrey  Lamke M.D.   On: 07/21/2014 11:50   Dg Chest Port 1 View  07/20/2014   CLINICAL DATA:  Acute onset of shortness of breath, wheezing and cough. Initial encounter.  EXAM: PORTABLE CHEST - 1 VIEW  COMPARISON:  Chest radiograph from 09/23/2013  FINDINGS: The lungs are well-aerated. A small to moderate right-sided pleural effusion is noted, with right basilar airspace opacification. This may reflect pneumonia or asymmetric pulmonary edema. No pneumothorax is seen.  The cardiomediastinal silhouette is borderline normal in size. The patient is status post median sternotomy. No acute osseous abnormalities are seen. A right shoulder hemiarthroplasty is grossly unremarkable in appearance.  IMPRESSION: Small to moderate right-sided pleural effusion, with right basilar airspace opacification. This may reflect pneumonia or asymmetric pulmonary edema.   Electronically Signed   By: Roanna RaiderJeffery  Chang M.D.   On: 07/20/2014 05:25    Microbiology: Recent Results (from the past 240 hour(s))  Culture, blood (routine x 2) Call MD if unable to obtain prior to antibiotics being given     Status: None (Preliminary result)   Collection Time: 07/20/14  9:42 AM  Result Value Ref Range Status   Specimen Description LEFT ANTECUBITAL  Final   Special Requests  BOTTLES DRAWN AEROBIC AND ANAEROBIC 6CC  Final   Culture NO GROWTH 2 DAYS  Final   Report Status PENDING  Incomplete  Culture, blood (routine x 2) Call MD if unable to obtain prior to antibiotics being given     Status: None (Preliminary result)   Collection Time: 07/20/14  9:45 AM  Result Value Ref Range Status   Specimen Description RIGHT ANTECUBITAL  Final   Special Requests BOTTLES DRAWN AEROBIC ONLY 6CC  Final   Culture NO GROWTH 2 DAYS  Final   Report Status PENDING  Incomplete     Labs: Basic Metabolic Panel:  Recent Labs Lab 07/20/14 0523 07/20/14 0942 07/21/14 0556 07/22/14 0631 07/23/14 0606  NA 140  --  139 138 139  K 3.7  --  4.1 4.2 3.7  CL 100  --  98 97 96  CO2 30  --  34* 35* 33*  GLUCOSE 89  --  112* 92 81  BUN 29*  --  42* 41* 38*  CREATININE 1.12  --  1.32 1.26 1.25  CALCIUM 9.0  --  8.7 8.6 9.0  MG  --  1.8  --   --   --    Liver Function Tests:  Recent Labs Lab 07/20/14 0523  AST 62*  ALT 26  ALKPHOS 62  BILITOT 0.6  PROT 6.8  ALBUMIN 3.2*   No results for input(s): LIPASE, AMYLASE in the last 168 hours. No results for input(s): AMMONIA in the last 168 hours. CBC:  Recent Labs Lab 07/20/14 0523 07/22/14 0631  WBC 5.9 8.6  NEUTROABS 3.4  --   HGB 11.4* 11.0*  HCT 37.0* 34.8*  MCV 98.7 96.7  PLT 161 161   Cardiac Enzymes:  Recent Labs Lab 07/20/14 0523 07/20/14 0942 07/20/14 1515 07/20/14 2159  TROPONINI 0.09* 0.08* 0.08* 0.07*   BNP: BNP (last 3 results)  Recent Labs  07/20/14 0523  BNP 467.0*    ProBNP (last 3 results)  Recent Labs  09/23/13 1738  PROBNP 1988.0*    CBG: No results for input(s): GLUCAP in the last 168 hours.     SignedEdsel Petrin  [facility] 07/23/2014, 10:58 AM

## 2014-07-23 NOTE — Clinical Social Work Note (Signed)
CSW received consult for possible placement. CSW met with pt's wife and other family at bedside. One daughter was present and another on speaker phone.  Pt has dementia and is oriented to self only. A daughter mentioned something about placement to MD this morning. Pt lives with wife and son. Pt's wife definitely wants him to return home. PT evaluated pt yesterday and recommendation was for home health. CSW discussed ALF/SNF options and payment for both. Family agree that pt will return home at d/c from hospital and they will consider placement from home if needed. They would like home health social worker to help address some of these issues. CM notified. Also suggested that family consider going to DSS to apply for Medicaid if qualifies. No other needs reported. CSW will sign off.  Benay Pike, [place]

## 2014-07-23 NOTE — Progress Notes (Signed)
Primary Cardiologist:McDowell, Samuel   Cardiology Specific Problem List: 1.Chronic Diastolic CHF 2. PAF 3. Demand Ischemia 4. CAD  Subjective:    Confused but no verbalized complaints.   Objective:   Temp:  [97.9 F (36.6 C)-98.4 F (36.9 C)] 97.9 F (36.6 C) (04/26 0549) Pulse Rate:  [69-105] 69 (04/26 0549) Resp:  [20] 20 (04/26 0549) BP: (117-146)/(58-117) 146/117 mmHg (04/26 0549) SpO2:  [97 %-100 %] 100 % (04/26 0711) Weight:  [128 lb 12 oz (58.4 kg)] 128 lb 12 oz (58.4 kg) (04/26 0549) Last BM Date:  (smear noted on pad)  Filed Weights   07/21/14 0608 07/22/14 0543 07/23/14 0549  Weight: 126 lb 8.7 oz (57.4 kg) 127 lb 10.3 oz (57.9 kg) 128 lb 12 oz (58.4 kg)    Intake/Output Summary (Last 24 hours) at 07/23/14 0855 Last data filed at 07/23/14 0550  Gross per 24 hour  Intake    486 ml  Output   1950 ml  Net  -1464 ml    Telemetry: None  Exam:  General: No acute distress.  HEENT: Conjunctiva and lids normal, oropharynx clear.  Lungs: Clear to auscultation, nonlabored.  Cardiac: No elevated JVP or bruits. IRRR, no gallop or rub.   Abdomen: Normoactive bowel sounds, nontender, nondistended.  Extremities: No pitting edema, distal pulses full.  Neuropsychiatric: Alert and oriented X 1,, affect appropriate.   Lab Results:  Basic Metabolic Panel:  Recent Labs Lab 07/20/14 0942 07/21/14 0556 07/22/14 0631 07/23/14 0606  NA  --  139 138 139  K  --  4.1 4.2 3.7  CL  --  98 97 96  CO2  --  34* 35* 33*  GLUCOSE  --  112* 92 81  BUN  --  42* 41* 38*  CREATININE  --  1.32 1.26 1.25  CALCIUM  --  8.7 8.6 9.0  MG 1.8  --   --   --     Liver Function Tests:  Recent Labs Lab 07/20/14 0523  AST 62*  ALT 26  ALKPHOS 62  BILITOT 0.6  PROT 6.8  ALBUMIN 3.2*    CBC:  Recent Labs Lab 07/20/14 0523 07/22/14 0631  WBC 5.9 8.6  HGB 11.4* 11.0*  HCT 37.0* 34.8*  MCV 98.7 96.7  PLT 161 161    Cardiac Enzymes:  Recent Labs Lab  07/20/14 0942 07/20/14 1515 07/20/14 2159  TROPONINI 0.08* 0.08* 0.07*    BNP:  Recent Labs  09/23/13 1738  PROBNP 1988.0*    Radiology: Dg Chest Port 1 View  07/21/2014   CLINICAL DATA:  Short of breath.  Acute onset shortness of breath.  EXAM: PORTABLE CHEST - 1 VIEW  COMPARISON:  07/20/2014.  FINDINGS: Pulmonary aeration has worsened compared to yesterday's exam. There is now all density at the LEFT lung base along with the density at the RIGHT lung base, probably representing combination of edema and atelectasis. Dense aortic atherosclerosis. CABG/ median sternotomy. Old granulomatous disease in the RIGHT upper lobe. Bilateral right-greater-than-left pleural effusions. Monitoring leads project over the chest. Severe LEFT glenohumeral osteoarthritis.  IMPRESSION: Worsening aeration likely representing a combination of effusion, atelectasis and pulmonary edema. Superimposed pneumonia is considered less likely but cannot be excluded. Aeration is worse on the RIGHT compared to the LEFT.   Electronically Signed   By: Andreas NewportGeoffrey  Lamke M.D.   On: 07/21/2014 11:50     Medications:   Scheduled Medications: . antiseptic oral rinse  7 mL Mouth Rinse BID  .  aspirin EC  81 mg Oral Daily  . brimonidine  1 drop Right Eye q1800   And  . timolol  1 drop Right Eye q1800  . diltiazem  120 mg Oral Daily  . furosemide  40 mg Intravenous BID  . heparin  5,000 Units Subcutaneous 3 times per day  . ipratropium-albuterol  3 mL Nebulization TID  . latanoprost  1 drop Right Eye QHS  . levofloxacin (LEVAQUIN) IV  750 mg Intravenous Q48H  . lisinopril  20 mg Oral Daily  . metoprolol  50 mg Oral BID  . potassium chloride  20 mEq Oral BID  . simvastatin  20 mg Oral q1800  . sodium chloride  3 mL Intravenous Q12H  . traZODone  50 mg Oral QHS    PRN Medications: sodium chloride, acetaminophen, ondansetron (ZOFRAN) IV, oxyCODONE-acetaminophen, sodium chloride   Assessment and Plan:   1. PAF: Heart  rate is well controlled on exam. He is not on telemetry. He is not a candidate for anticoagulation due to falls. CHADS VASC Score 4. Continues on Cardizem and Lopressor.   2. Diastolic CHF: He has been diuresed 3.6 liters and is clinically compensated. He appears comfortable without LEE or abdominal distention. Will continue lasix po but increase dose to 40 mg daily from 20 mg daily to prevent decompensation. He will need follow up BMET post discharge. Creatinine 1.25.   3. CAD: Evidence of demand ischemia. No planned cardiac testing now in the setting of multiple co-morbidities.   4 Community Acquired Pneumonia: Continues on IV antibiotics, plan per [facility]. Possible D/C today.       Bettey Mare. Lawrence NP [facility]  07/23/2014, 8:55 AM   Patient seen and discussed with NP Lyman Bishop, I agree with her documentation above. 81 yo male hx of CAD with prior CABG, HL, afib, dementia admitted with SOB. Found to have pneumonia as well as acute on chronic diastolic HF. Echo this admit LVEF 55-60%, diastolic function not described. He is negative 1.4 liters yesterday and 3.6 liters since admit, was on lasix  IV bid and changed to  oral once daily this morning. Renal function remains stable. Afib rate control with lopressor and dilt, he has not been on anticoag due to fall risk. Very mild trop elevation in setting of CHF and pneumonia, no plan for ischemic testing at this time.

## 2014-07-23 NOTE — Discharge Instructions (Signed)

## 2014-07-24 NOTE — Care Management Utilization Note (Signed)
UR completed 

## 2014-07-25 LAB — CULTURE, BLOOD (ROUTINE X 2)
CULTURE: NO GROWTH
Culture: NO GROWTH

## 2014-07-31 ENCOUNTER — Other Ambulatory Visit: Payer: Self-pay | Admitting: *Deleted

## 2014-07-31 DIAGNOSIS — I509 Heart failure, unspecified: Secondary | ICD-10-CM

## 2014-07-31 NOTE — Patient Outreach (Signed)
[facility] Care Management  07/31/2014  Maryjean MornGeorge H Vanduyn [birth year] [id number]   [facility] referral:  Recent discharge from hospital with diagnosis of heart failure 07/23/2014.  Telephone call to listed contact person; left message with person who answered call who advised me to call back later in day.  Return telephone call  to contact person who is daughter-Crystal. Caregiver states patient has dementia. HIPPA verification received. Contact is also one of patient's caregivers. [facility] was explained to caregiver.  She advised that [facility] with RN, Social worker, Physical therapist and Occupational therapist  started this week.  She states that her parents  get unsettled when there are lots of people in and  out of their home.  States she will take the transition of care calls but declines [facility] at this time.  Plan: will refer to community care coordinator for Transition of Care program.  Colleen CanLinda Garry Nicolini, RN BSN CCM Care Management Coordinator [facility]  [phone]

## 2014-08-06 ENCOUNTER — Ambulatory Visit (INDEPENDENT_AMBULATORY_CARE_PROVIDER_SITE_OTHER): Payer: Medicare HMO | Admitting: Adult Health

## 2014-08-06 ENCOUNTER — Other Ambulatory Visit (HOSPITAL_COMMUNITY)
Admission: RE | Admit: 2014-08-06 | Discharge: 2014-08-06 | Disposition: A | Discharge status: Home / Self Care | Payer: Medicare HMO | Source: Ambulatory Visit | Attending: Adult Health | Admitting: Adult Health

## 2014-08-06 ENCOUNTER — Encounter: Payer: Self-pay | Admitting: Adult Health

## 2014-08-06 VITALS — BP 108/70 | HR 65

## 2014-08-06 DIAGNOSIS — I10 Essential (primary) hypertension: Secondary | ICD-10-CM

## 2014-08-06 LAB — BASIC METABOLIC PANEL
BUN: 44 mg/dL — AB (ref 6–23)
CALCIUM: 8.7 mg/dL (ref 8.4–10.5)
CO2: 35 mEq/L — ABNORMAL HIGH (ref 19–32)
CREATININE: 1.4 mg/dL — AB (ref 0.50–1.35)
Chloride: 95 mEq/L — ABNORMAL LOW (ref 96–112)
Glucose, Bld: 101 mg/dL — ABNORMAL HIGH (ref 70–99)
Potassium: 3.1 mEq/L — ABNORMAL LOW (ref 3.5–5.3)
Sodium: 138 mEq/L (ref 135–145)

## 2014-08-06 MED ORDER — LISINOPRIL-HYDROCHLOROTHIAZIDE 20-12.5 MG PO TABS: 1.0000 | ORAL_TABLET | Freq: Every day | ORAL | Status: DC

## 2014-08-06 MED ORDER — METOPROLOL TARTRATE 50 MG PO TABS: 50.0000 mg | ORAL_TABLET | Freq: Once | ORAL | Status: DC

## 2014-08-06 MED ORDER — FUROSEMIDE 40 MG PO TABS: 40.0000 mg | ORAL_TABLET | Freq: Every day | ORAL | Status: DC

## 2014-08-06 NOTE — Patient Instructions (Signed)
Your physician recommends that you schedule a follow-up appointment in: 3 months with Kathryn Lawrence, NP   Your physician recommends that you continue on your current medications as directed. Please refer to the Current Medication list given to you today.  Thank you for choosing  [facility]!     

## 2014-08-06 NOTE — Progress Notes (Signed)
Cardiology Office Note   Date:  08/06/2014   ID:  Jacob MornGeorge H Stakes, DOB 29-Jun-1923, MRN [id number]  PCP:  Trinna PostKOBERLEIN, JUNELL CAROL, MD  Cardiologist:  [place]/ Joni ReiningKathryn Jaimeson Gopal, NP   Chief Complaint  Patient presents with  . Congestive Heart Failure  . Atrial Fibrillation  . Coronary Artery Disease      History of Present Illness: [name] is a 81 y.o. male who presents for hospital followup after being seen on consultation by Dr.Branchin the setting of diastolic CHF, paroxysmal atrial fibrillation, with history of CAD.  The patient was discharged on 07/23/2014 , and was diuresis to 6 L during hospitalization on IV Lasix.    The patient was discharged on Lasix 40 mg daily, he was treated for community-acquired pneumonia, he was not placed on anticoagulation due to fall risk, and dementia in the setting of paroxysmal atrial fibrillation.  He was found to have mildly elevated troponin, which was felt to be related to demand ischemia with highest troponin is 0.09.    He was continued on cardiac prophylactic medications to include aspirin, lisinopril, metoprolol, and a statin.  Echocardiogram completed on 07/21/2014 revealed an EF of 55-60% with LVOT thickened and calcified.  He is without complaints today.  He has dementia and therefore, is a poor historian.  His daughter, who is with him states that he is continued to be confused and she takes care of his medicine, make sure he drinks, and is diuresing well.  Physical therapy and occupational therapy were also visiting the patient at home.  Past Medical History  Diagnosis Date  . CAD (coronary artery disease)     CABG 03/2001  . Hyperlipidemia   . DJD (degenerative joint disease)     Neck, shoulder and knees  . BPH (benign prostatic hypertrophy)   . Carotid artery disease   . PAF (paroxysmal atrial fibrillation)     Postoperative  . Essential hypertension   . Tobacco abuse, in remission     Discontinued in 1990  . Paroxysmal  atrial flutter   . Dementia     Past Surgical History  Procedure Laterality Date  . Cataract extraction w/ intraocular lens implant Right   . Coronary artery bypass graft  2003  . Shoulder hemi-arthroplasty Right     Dr. Romeo AppleHarrison     Current Outpatient Prescriptions  Medication Sig Dispense Refill  . aspirin 81 MG tablet Take 81 mg by mouth daily.     . bimatoprost (LUMIGAN) 0.01 % SOLN Place 1 drop into the right eye at bedtime.    . brimonidine-timolol (COMBIGAN) 0.2-0.5 % ophthalmic solution Place 1 drop into the right eye daily at 6 PM.     . diltiazem (CARDIZEM CD) 120 MG 24 hr capsule Take 1 capsule (120 mg total) by mouth daily. 30 capsule 0  . docusate sodium (COLACE) 100 MG capsule Take 100 mg by mouth 2 (two) times daily.    . furosemide (LASIX) 40 MG tablet Take 1 tablet (40 mg total) by mouth daily. 30 tablet 11  . levofloxacin (LEVAQUIN) 750 MG tablet Take 1 tablet (750 mg total) by mouth every other day. 2 tablet 0  . lisinopril-hydrochlorothiazide (PRINZIDE,ZESTORETIC) 20-12.5 MG per tablet Take 1 tablet by mouth daily. 90 tablet 3  . LORazepam (ATIVAN) 0.5 MG tablet Take 0.5 tablets (0.25 mg total) by mouth at bedtime as needed for anxiety or sleep. 30 tablet 0  . metoprolol (LOPRESSOR) 50 MG tablet Take 1 tablet (50 mg total)  by mouth once. 90 tablet 3  . oxyCODONE-acetaminophen (PERCOCET/ROXICET) 5-325 MG per tablet Take 1 tablet by mouth every 6 (six) hours as needed for severe pain.    . simvastatin (ZOCOR) 20 MG tablet Take 1 tablet (20 mg total) by mouth daily. 90 tablet 0  . traZODone (DESYREL) 50 MG tablet Take 50 mg by mouth at bedtime.     No current facility-administered medications for this visit.    Allergies:   Review of patient's allergies indicates no known allergies.    Social History:  The patient  reports that he has quit smoking. His smoking use included Cigarettes. He does not have any smokeless tobacco history on file. He reports that he does  not drink alcohol or use illicit drugs.   Family History:  The patient's family history includes Sudden death (age of onset: 2356) in his father.    ROS: .   All other systems are reviewed and negative.Unless otherwise mentioned in H&P above.   PHYSICAL EXAM: VS:  BP 108/70 mmHg  Pulse 65 , BMI There is no weight on file to calculate BMI. GEN: Well nourished, well developed, in no acute distress HEENT: normal Neck: no JVD, carotid bruits, or masses Cardiac: IRRR; with 1/6 systolic murmur  rubs, or gallops,no edema  Respiratory:  Clear to auscultation bilaterally, normal work of breathing GI: soft, nontender, nondistended, + BS MS: no deformity or atrophy Skin: warm and dry, no rash Neuro:  Strength and sensation are intact Psych: euthymic mood, full affect   Recent Labs: 09/23/2013: Pro B Natriuretic peptide (BNP) 1988.0* 07/20/2014: ALT 26; B Natriuretic Peptide 467.0*; Magnesium 1.8 07/22/2014: Hemoglobin 11.0*; Platelets 161 07/23/2014: BUN 38*; Creatinine 1.25; Potassium 3.7; Sodium 139    Lipid Panel    Component Value Date/Time   CHOL 140 12/08/2010 1350   TRIG 64 12/08/2010 1350   HDL 42 12/08/2010 1350   CHOLHDL 3.3 12/08/2010 1350   VLDL 13 12/08/2010 1350   LDLCALC 85 12/08/2010 1350      Wt Readings from Last 3 Encounters:  07/23/14 128 lb 12 oz (58.4 kg)  10/02/12 145 lb (65.772 kg)  07/27/12 150 lb (68.04 kg)      Other studies Reviewed: Additional studies/ records that were reviewed today include: None Review of the above records demonstrates: N/A   ASSESSMENT AND PLAN:  1. Acute on chronic diastolic heart failure.: the patient appears well compensated.  He is not gaining fluid.  He does not have edema.  Although he has dementia, he does not have any complaints of shortness of breath.  Does not appear dehydrated.  I will check a BMET as he is on 40 mg of Lasix to evaluate renal function.  The patient will be seen in 3 months unless symptomatic.  2.  Atrial fib: Rx, will control, and her medication regimen.  He is not on anticoagulation due to the debilitated state and high risk for fall.   Current medicines are reviewed at length with the patient today.    Labs/ tests ordered today include:BMET  Orders Placed This Encounter  Procedures  . Basic Metabolic Panel (BMET)     Disposition:   FU with 3 months Signed, Joni ReiningKathryn Keoni Havey, NP  08/06/2014 4:08 PM    [facility] [address], [place], [place] [address] Phone: [phone]; Fax: [phone]

## 2014-08-06 NOTE — Progress Notes (Deleted)
Name: [name]    DOB: Aug 14, 1923  Age: 81 y.o.  MR#: [id number]       PCP:  Trinna PostKOBERLEIN, JUNELL CAROL, MD      Insurance: Payor: [email or web address] MEDICARE / Plan: HU[employer]NA MEDICARE HMO / Product Type: *No Product type* /   CC:    Chief Complaint  Patient presents with  . Congestive Heart Failure  . Atrial Fibrillation  . Coronary Artery Disease    VS Filed Vitals:   08/06/14 1434  BP: 108/70  Pulse: 65    Weights Current Weight  07/23/14 128 lb 12 oz (58.4 kg)  10/02/12 145 lb (65.772 kg)  07/27/12 150 lb (68.04 kg)    Blood Pressure  BP Readings from Last 3 Encounters:  08/06/14 108/70  07/23/14 131/95  09/23/13 127/48     Admit date:  (Not on file) Last encounter with RMR:  Visit date not found   Allergy Review of patient's allergies indicates no known allergies.  Current Outpatient Prescriptions  Medication Sig Dispense Refill  . aspirin 81 MG tablet Take 81 mg by mouth daily.     . bimatoprost (LUMIGAN) 0.01 % SOLN Place 1 drop into the right eye at bedtime.    . brimonidine-timolol (COMBIGAN) 0.2-0.5 % ophthalmic solution Place 1 drop into the right eye daily at 6 PM.     . diltiazem (CARDIZEM CD) 120 MG 24 hr capsule Take 1 capsule (120 mg total) by mouth daily. 30 capsule 0  . docusate sodium (COLACE) 100 MG capsule Take 100 mg by mouth 2 (two) times daily.    . furosemide (LASIX) 40 MG tablet Take 1 tablet (40 mg total) by mouth daily. 30 tablet 0  . levofloxacin (LEVAQUIN) 750 MG tablet Take 1 tablet (750 mg total) by mouth every other day. 2 tablet 0  . lisinopril-hydrochlorothiazide (PRINZIDE,ZESTORETIC) 20-12.5 MG per tablet Take 1 tablet by mouth daily.     Marland Kitchen. LORazepam (ATIVAN) 0.5 MG tablet Take 0.5 tablets (0.25 mg total) by mouth at bedtime as needed for anxiety or sleep. 30 tablet 0  . metoprolol (LOPRESSOR) 50 MG tablet Take 50 mg by mouth once.     Marland Kitchen. oxyCODONE-acetaminophen (PERCOCET/ROXICET) 5-325 MG per tablet Take 1 tablet by mouth every 6 (six) hours as  needed for severe pain.    . simvastatin (ZOCOR) 20 MG tablet Take 1 tablet (20 mg total) by mouth daily. 90 tablet 0  . traZODone (DESYREL) 50 MG tablet Take 50 mg by mouth at bedtime.     No current facility-administered medications for this visit.    Discontinued Meds:   There are no discontinued medications.  Patient Active Problem List   Diagnosis Date Noted  . SOB (shortness of breath)   . Elevated troponin   . Shortness of breath 07/20/2014  . Acute CHF 07/20/2014  . Coronary atherosclerosis of native coronary artery   . Hyperlipidemia   . PAF (paroxysmal atrial fibrillation)   . Hypertension   . Cerebrovascular disease 01/27/2005    LABS    Component Value Date/Time   NA 139 07/23/2014 0606   NA 138 07/22/2014 0631   NA 139 07/21/2014 0556   K 3.7 07/23/2014 0606   K 4.2 07/22/2014 0631   K 4.1 07/21/2014 0556   CL 96 07/23/2014 0606   CL 97 07/22/2014 0631   CL 98 07/21/2014 0556   CO2 33* 07/23/2014 0606   CO2 35* 07/22/2014 0631   CO2 34* 07/21/2014 0556   GLUCOSE  81 07/23/2014 0606   GLUCOSE 92 07/22/2014 0631   GLUCOSE 112* 07/21/2014 0556   BUN 38* 07/23/2014 0606   BUN 41* 07/22/2014 0631   BUN 42* 07/21/2014 0556   CREATININE 1.25 07/23/2014 0606   CREATININE 1.26 07/22/2014 0631   CREATININE 1.32 07/21/2014 0556   CREATININE 1.38* 10/02/2012 1335   CREATININE 1.29 12/08/2010 1350   CALCIUM 9.0 07/23/2014 0606   CALCIUM 8.6 07/22/2014 0631   CALCIUM 8.7 07/21/2014 0556   GFRNONAA 49* 07/23/2014 0606   GFRNONAA 49* 07/22/2014 0631   GFRNONAA 46* 07/21/2014 0556   GFRAA 57* 07/23/2014 0606   GFRAA 57* 07/22/2014 0631   GFRAA 53* 07/21/2014 0556   CMP     Component Value Date/Time   NA 139 07/23/2014 0606   K 3.7 07/23/2014 0606   CL 96 07/23/2014 0606   CO2 33* 07/23/2014 0606   GLUCOSE 81 07/23/2014 0606   BUN 38* 07/23/2014 0606   CREATININE 1.25 07/23/2014 0606   CREATININE 1.38* 10/02/2012 1335   CALCIUM 9.0 07/23/2014 0606    PROT 6.8 07/20/2014 0523   ALBUMIN 3.2* 07/20/2014 0523   AST 62* 07/20/2014 0523   ALT 26 07/20/2014 0523   ALKPHOS 62 07/20/2014 0523   BILITOT 0.6 07/20/2014 0523   GFRNONAA 49* 07/23/2014 0606   GFRAA 57* 07/23/2014 0606       Component Value Date/Time   WBC 8.6 07/22/2014 0631   WBC 5.9 07/20/2014 0523   WBC 5.4 09/23/2013 1738   HGB 11.0* 07/22/2014 0631   HGB 11.4* 07/20/2014 0523   HGB 11.8* 09/23/2013 1738   HCT 34.8* 07/22/2014 0631   HCT 37.0* 07/20/2014 0523   HCT 37.1* 09/23/2013 1738   MCV 96.7 07/22/2014 0631   MCV 98.7 07/20/2014 0523   MCV 93.5 09/23/2013 1738    Lipid Panel     Component Value Date/Time   CHOL 140 12/08/2010 1350   TRIG 64 12/08/2010 1350   HDL 42 12/08/2010 1350   CHOLHDL 3.3 12/08/2010 1350   VLDL 13 12/08/2010 1350   LDLCALC 85 12/08/2010 1350    ABG No results found for: PHART, PCO2ART, PO2ART, HCO3, TCO2, ACIDBASEDEF, O2SAT   Lab Results  Component Value Date   TSH 0.738 08/26/2011   BNP (last 3 results)  Recent Labs  07/20/14 0523  BNP 467.0*    ProBNP (last 3 results)  Recent Labs  09/23/13 1738  PROBNP 1988.0*    Cardiac Panel (last 3 results) No results for input(s): CKTOTAL, CKMB, TROPONINI, RELINDX in the last 72 hours.  Iron/TIBC/Ferritin/ %Sat No results found for: IRON, TIBC, FERRITIN, IRONPCTSAT   EKG Orders placed or performed during the hospital encounter of 07/20/14  . ED EKG  . ED EKG  . EKG 12-Lead  . EKG 12-Lead  . EKG 12-Lead  . EKG  . EKG 12-Lead     Prior Assessment and Plan Problem List as of 08/06/2014      Cardiovascular and Mediastinum   Coronary atherosclerosis of native coronary artery   Last Assessment & Plan 10/02/2012 Office Visit Written 10/02/2012  1:37 PM by Jodelle GrossKathryn M Lawrence, NP    He offers no cardiac complaints today. He continues to be active, despite chronic hip pain.  No planned cardiac testing ordered.       Cerebrovascular disease   Last Assessment & Plan  12/08/2010 Office Visit Written 12/09/2010  9:46 AM by Kathlen Brunswickobert M Rothbart, MD    Recent carotid ultrasound reveals no significant focal  internal carotid artery stenosis although substantial plaque is present as well as progression of disease in the external carotids.  Continued surveillance is appropriate.      PAF (paroxysmal atrial fibrillation)   Last Assessment & Plan 03/06/2012 Office Visit Written 03/06/2012  1:27 PM by Jonelle Sidle, MD    No recurrences, was noted postoperatively. Remains in sinus rhythm now, no plan for anticoagulation.      Hypertension   Last Assessment & Plan 10/02/2012 Office Visit Written 10/02/2012  1:39 PM by Jodelle Gross, NP    He is a little hypertensive on exam today. I have rechecked his BP manually. !42/80.  We will check a BMET for evaluation of his kidney status on NSAID, HCTZ and lasix. He is only supposed to be on 20 mg of lasix daily. He has two bottles in his medication bag of this. I have marked them with an "X" and told him that he only needs one of the lasix daily and to ask his daughter to review the bottles.      Acute CHF     Other   Hyperlipidemia   Last Assessment & Plan 10/02/2012 Office Visit Written 10/02/2012  1:40 PM by Jodelle Gross, NP    Followed by his PCP for this.      Shortness of breath   SOB (shortness of breath)   Elevated troponin       Imaging: Dg Chest Port 1 View  07/23/2014   CLINICAL DATA:  CHF.  EXAM: PORTABLE CHEST - 1 VIEW  COMPARISON:  07/21/2014.  FINDINGS: Mediastinum and hilar structures are normal. Prior CABG. Slight improvement of cardiomegaly and pulmonary venous congestion. Interim slight clearing of bilateral pulmonary infiltrates/edema. Findings consistent with persistent changes of congestive heart failure. Persistent right pleural effusion. No pneumothorax. No acute bony abnormality . Degenerative changes left shoulder.  IMPRESSION: Prior CABG. Interim partial improvement of congestive heart  failure with interim partial clearing of pulmonary edema and right pleural effusion .   Electronically Signed   By: Maisie Fus  Register   On: 07/23/2014 12:52   Dg Chest Port 1 View  07/21/2014   CLINICAL DATA:  Short of breath.  Acute onset shortness of breath.  EXAM: PORTABLE CHEST - 1 VIEW  COMPARISON:  07/20/2014.  FINDINGS: Pulmonary aeration has worsened compared to yesterday's exam. There is now all density at the LEFT lung base along with the density at the RIGHT lung base, probably representing combination of edema and atelectasis. Dense aortic atherosclerosis. CABG/ median sternotomy. Old granulomatous disease in the RIGHT upper lobe. Bilateral right-greater-than-left pleural effusions. Monitoring leads project over the chest. Severe LEFT glenohumeral osteoarthritis.  IMPRESSION: Worsening aeration likely representing a combination of effusion, atelectasis and pulmonary edema. Superimposed pneumonia is considered less likely but cannot be excluded. Aeration is worse on the RIGHT compared to the LEFT.   Electronically Signed   By: Andreas Newport M.D.   On: 07/21/2014 11:50   Dg Chest Port 1 View  07/20/2014   CLINICAL DATA:  Acute onset of shortness of breath, wheezing and cough. Initial encounter.  EXAM: PORTABLE CHEST - 1 VIEW  COMPARISON:  Chest radiograph from 09/23/2013  FINDINGS: The lungs are well-aerated. A small to moderate right-sided pleural effusion is noted, with right basilar airspace opacification. This may reflect pneumonia or asymmetric pulmonary edema. No pneumothorax is seen.  The cardiomediastinal silhouette is borderline normal in size. The patient is status post median sternotomy. No acute osseous abnormalities are seen.  A right shoulder hemiarthroplasty is grossly unremarkable in appearance.  IMPRESSION: Small to moderate right-sided pleural effusion, with right basilar airspace opacification. This may reflect pneumonia or asymmetric pulmonary edema.   Electronically Signed   By:  Roanna Raider M.D.   On: 07/20/2014 05:25

## 2014-08-07 ENCOUNTER — Other Ambulatory Visit: Payer: Self-pay | Admitting: *Deleted

## 2014-08-07 NOTE — Patient Outreach (Signed)
TOC call week #2 Call to patient home, spoke with daughter, Aggie CosierCrystal. Patient doing well, this week, still has Home health services that will continue another 2-3 weeks. Patient daughter states patient saw Cardiologist yesterday, was dehydrated, they may cut lasix back to 20mg . Patient started something for anxiety a couple of weeks ago, tolerating it well. Daughter verbalizes there is a lot of support from patient wife and family.  RNCM [facility], agrees to continue weekly calls and may want home visit after home health has finished.  Plan to continue calls and will continue to assess for possible home visit needs after Home health has discharged.  Alben SpittleMary E. Albertha GheeNiemczura, RN, BSN, CCM  [facility] [employer] Care Manager [phone]

## 2014-08-13 ENCOUNTER — Telehealth: Payer: Self-pay

## 2014-08-13 DIAGNOSIS — I10 Essential (primary) hypertension: Secondary | ICD-10-CM

## 2014-08-13 MED ORDER — POTASSIUM CHLORIDE CRYS ER 20 MEQ PO TBCR: 20.0000 meq | EXTENDED_RELEASE_TABLET | Freq: Every day | ORAL | Status: DC

## 2014-08-13 MED ORDER — FUROSEMIDE 20 MG PO TABS: 30.0000 mg | ORAL_TABLET | Freq: Every day | ORAL | Status: DC

## 2014-08-13 NOTE — Telephone Encounter (Signed)
-----   Message from Jodelle GrossKathryn M Lawrence, NP sent at 08/09/2014  7:13 AM EDT ----- Decrease lasix to 30 mg daily 1 1/2 tablet of 20 mg. Start potassium 20 mEq daily. Repeat BMET in one week after changing his medications.

## 2014-08-13 NOTE — Telephone Encounter (Signed)
I spoke with wife as pt unavailable. She understands to decrease lasix to 30 mg daily and start KCL 20 meq daily.I have mailed lab slip for fu BMET in 1 week

## 2014-08-23 ENCOUNTER — Other Ambulatory Visit: Payer: Self-pay | Admitting: *Deleted

## 2014-08-23 NOTE — Patient Outreach (Signed)
Call to [facility], [facility] Patient doing well at this time, no new issues or problems for himself but patient son is now under Hospice care and not expected to live long. Daughter states she has gotten FMLA to be "on call" for patient and family. She also states the main problem today is bathing, wife does not want a male aide assisting with patient bath and the wife is not able to complete bath. Patient does have Medicaid per daughter, discussed resources of PCS services and to contact [employer] regarding the program they have there. Gave her the number for her to call, and offered [facility] as indicated. Daughter unable to talk further but states it is welcome for RNCM to follow up with them next week for further assistance and questions.   Plan to follow up next week.   Alben SpittleMary E. Albertha GheeNiemczura, RN, BSN, CCM  [facility] [employer] Care Manager [phone]

## 2014-08-29 ENCOUNTER — Other Ambulatory Visit: Payer: Self-pay | Admitting: *Deleted

## 2014-08-29 NOTE — Patient Outreach (Signed)
TOC week #4 attempted Left VM with patient daughter Crystal. Plan to follow up on PCS services Will close out of TOC if no new issues  Everline Mahaffy E. Albertha GheeNiemczura, RN, BSN, CCM  [facility] [employer] Care Manager [phone]

## 2014-09-04 ENCOUNTER — Encounter: Payer: Self-pay | Admitting: *Deleted

## 2014-09-04 ENCOUNTER — Other Ambulatory Visit: Payer: Self-pay | Admitting: *Deleted

## 2014-09-04 NOTE — Patient Outreach (Signed)
Call to patient daughter Crystal to [facility]ow [facility] regarding [facility] through Medicaid. She has not had a chance to call [employer] as they had a death in the family last week. States she will follow up on that when things start to settle down some.  Discussed case closure and verified no need for home visit. Daughter agrees to case closure. Daughter has RNCM contact for any future questions or needs. Plan to close case per protocol. Alben SpittleMary E. Albertha GheeNiemczura, RN, BSN, CCM  [facility] [employer] Care Manager [phone]

## 2014-09-09 ENCOUNTER — Emergency Department (HOSPITAL_COMMUNITY): Payer: Commercial Managed Care - HMO

## 2014-09-09 ENCOUNTER — Emergency Department (HOSPITAL_COMMUNITY)
Admission: EM | Admit: 2014-09-09 | Discharge: 2014-09-09 | Disposition: A | Discharge status: Home / Self Care | Payer: Commercial Managed Care - HMO | Attending: Emergency Medicine | Admitting: Emergency Medicine

## 2014-09-09 ENCOUNTER — Encounter (HOSPITAL_COMMUNITY): Payer: Self-pay | Admitting: Emergency Medicine

## 2014-09-09 DIAGNOSIS — Z87448 Personal history of other diseases of urinary system: Secondary | ICD-10-CM | POA: Diagnosis not present

## 2014-09-09 DIAGNOSIS — F0390 Unspecified dementia without behavioral disturbance: Secondary | ICD-10-CM | POA: Insufficient documentation

## 2014-09-09 DIAGNOSIS — J9811 Atelectasis: Secondary | ICD-10-CM | POA: Diagnosis not present

## 2014-09-09 DIAGNOSIS — Z79899 Other long term (current) drug therapy: Secondary | ICD-10-CM | POA: Diagnosis not present

## 2014-09-09 DIAGNOSIS — I480 Paroxysmal atrial fibrillation: Secondary | ICD-10-CM | POA: Insufficient documentation

## 2014-09-09 DIAGNOSIS — M159 Polyosteoarthritis, unspecified: Secondary | ICD-10-CM | POA: Insufficient documentation

## 2014-09-09 DIAGNOSIS — I10 Essential (primary) hypertension: Secondary | ICD-10-CM | POA: Insufficient documentation

## 2014-09-09 DIAGNOSIS — I509 Heart failure, unspecified: Secondary | ICD-10-CM | POA: Diagnosis not present

## 2014-09-09 DIAGNOSIS — E785 Hyperlipidemia, unspecified: Secondary | ICD-10-CM | POA: Insufficient documentation

## 2014-09-09 DIAGNOSIS — M7989 Other specified soft tissue disorders: Secondary | ICD-10-CM

## 2014-09-09 DIAGNOSIS — I2510 Atherosclerotic heart disease of native coronary artery without angina pectoris: Secondary | ICD-10-CM | POA: Insufficient documentation

## 2014-09-09 DIAGNOSIS — R600 Localized edema: Secondary | ICD-10-CM | POA: Diagnosis not present

## 2014-09-09 DIAGNOSIS — Z951 Presence of aortocoronary bypass graft: Secondary | ICD-10-CM | POA: Diagnosis not present

## 2014-09-09 DIAGNOSIS — Z87891 Personal history of nicotine dependence: Secondary | ICD-10-CM | POA: Insufficient documentation

## 2014-09-09 DIAGNOSIS — Z7982 Long term (current) use of aspirin: Secondary | ICD-10-CM | POA: Diagnosis not present

## 2014-09-09 LAB — CBC WITH DIFFERENTIAL/PLATELET
BASOS PCT: 0 % (ref 0–1)
Basophils Absolute: 0 10*3/uL (ref 0.0–0.1)
Eosinophils Absolute: 0.1 10*3/uL (ref 0.0–0.7)
Eosinophils Relative: 1 % (ref 0–5)
HCT: 37.1 % — ABNORMAL LOW (ref 39.0–52.0)
Hemoglobin: 11.4 g/dL — ABNORMAL LOW (ref 13.0–17.0)
LYMPHS ABS: 1.2 10*3/uL (ref 0.7–4.0)
Lymphocytes Relative: 16 % (ref 12–46)
MCH: 30.7 pg (ref 26.0–34.0)
MCHC: 30.7 g/dL (ref 30.0–36.0)
MCV: 100 fL (ref 78.0–100.0)
MONO ABS: 0.6 10*3/uL (ref 0.1–1.0)
MONOS PCT: 8 % (ref 3–12)
NEUTROS ABS: 5.6 10*3/uL (ref 1.7–7.7)
Neutrophils Relative %: 75 % (ref 43–77)
Platelets: 173 10*3/uL (ref 150–400)
RBC: 3.71 MIL/uL — ABNORMAL LOW (ref 4.22–5.81)
RDW: 15.6 % — ABNORMAL HIGH (ref 11.5–15.5)
WBC: 7.5 10*3/uL (ref 4.0–10.5)

## 2014-09-09 LAB — BASIC METABOLIC PANEL
ANION GAP: 10 (ref 5–15)
BUN: 55 mg/dL — AB (ref 6–20)
CO2: 33 mmol/L — ABNORMAL HIGH (ref 22–32)
CREATININE: 1.2 mg/dL (ref 0.61–1.24)
Calcium: 9.1 mg/dL (ref 8.9–10.3)
Chloride: 101 mmol/L (ref 101–111)
GFR calc Af Amer: 60 mL/min — ABNORMAL LOW (ref >60)
GFR calc non Af Amer: 52 mL/min — ABNORMAL LOW (ref >60)
Glucose, Bld: 87 mg/dL (ref 65–99)
POTASSIUM: 3.6 mmol/L (ref 3.5–5.1)
Sodium: 144 mmol/L (ref 135–145)

## 2014-09-09 NOTE — Discharge Instructions (Signed)

## 2014-09-09 NOTE — ED Notes (Signed)
Patient brought in by caregiver with complaints of bilateral feet swelling. States he has a history of heart failure.

## 2014-09-09 NOTE — ED Provider Notes (Signed)
CSN: [id number]     Arrival date & time 09/09/14  1038 History  This chart was scribed for Azalia Bilis, MD by Tanda Rockers, ED Scribe. This patient was seen in room APA06/APA06 and the patient's care was started at 11:45 AM.  Chief Complaint  Patient presents with  . Leg Swelling   LEVEL 5 CAVEAT for dementia  The history is provided by the patient and a caregiver. No language interpreter was used.     HPI Comments: [name] is a 81 y.o. male brought in by caregiver, with hx dementia, CAD, HLD, HTN, and heart failure who presents to the Emergency Department complaining of bilateral ankle swelling that occurred today. Caregiver mentions that upon administering meds to pt she noticed increased swelling to the feet. Caregiver called PCP to see if pt could be seen today. They were unable to do so and advised caregiver to bring pt to the ED for further evaluation. Pt lives at home with his wife who is 61 years old and is unable to take care of pt. She mentions that pt is not being taken care of properly due to pt's wife refusing anyone to come into the home to help out. Pt is in the process of being transferred to a nursing home but does not have a follow up appointment with PCP for FL2 until 09/23/2014 ( 2 weeks from now). Pt was seen by cardiologist on 05/10 and was unable to have blood drawn at that time due to having poor blood flow. Caregiver denies any other symptoms.    Past Medical History  Diagnosis Date  . CAD (coronary artery disease)     CABG 03/2001  . Hyperlipidemia   . DJD (degenerative joint disease)     Neck, shoulder and knees  . BPH (benign prostatic hypertrophy)   . Carotid artery disease   . PAF (paroxysmal atrial fibrillation)     Postoperative  . Essential hypertension   . Tobacco abuse, in remission     Discontinued in 1990  . Paroxysmal atrial flutter   . Dementia    Past Surgical History  Procedure Laterality Date  . Cataract extraction w/ intraocular  lens implant Right   . Coronary artery bypass graft  2003  . Shoulder hemi-arthroplasty Right     Dr. Romeo Apple   Family History  Problem Relation Age of Onset  . Sudden death Father 67   History  Substance Use Topics  . Smoking status: Former Smoker    Types: Cigarettes  . Smokeless tobacco: Not on file  . Alcohol Use: No    Review of Systems  Unable to perform ROS: Dementia      Allergies  Review of patient's allergies indicates no known allergies.  Home Medications   Prior to Admission medications   Medication Sig Start Date End Date Taking? Authorizing Provider  aspirin 81 MG tablet Take 81 mg by mouth daily.     Historical Provider, MD  bimatoprost (LUMIGAN) 0.01 % SOLN Place 1 drop into the right eye at bedtime.    Historical Provider, MD  brimonidine-timolol (COMBIGAN) 0.2-0.5 % ophthalmic solution Place 1 drop into the right eye daily at 6 PM.     Historical Provider, MD  diltiazem (CARDIZEM CD) 120 MG 24 hr capsule Take 1 capsule (120 mg total) by mouth daily. 07/23/14   Maryann Mikhail, DO  docusate sodium (COLACE) 100 MG capsule Take 100 mg by mouth 2 (two) times daily.    Historical Provider, MD  furosemide (LASIX) 20 MG tablet Take 1.5 tablets (30 mg total) by mouth daily. 08/13/14   Jodelle Gross, NP  levofloxacin (LEVAQUIN) 750 MG tablet Take 1 tablet (750 mg total) by mouth every other day. Patient not taking: Reported on 08/07/2014 07/23/14   Nita Sells Mikhail, DO  lisinopril-hydrochlorothiazide (PRINZIDE,ZESTORETIC) 20-12.5 MG per tablet Take 1 tablet by mouth daily. 08/06/14   Jodelle Gross, NP  LORazepam (ATIVAN) 0.5 MG tablet Take 0.5 tablets (0.25 mg total) by mouth at bedtime as needed for anxiety or sleep. 07/23/14   Maryann Mikhail, DO  metoprolol (LOPRESSOR) 50 MG tablet Take 1 tablet (50 mg total) by mouth once. 08/06/14   Jodelle Gross, NP  oxyCODONE-acetaminophen (PERCOCET/ROXICET) 5-325 MG per tablet Take 1 tablet by mouth every 6 (six) hours  as needed for severe pain.    Historical Provider, MD  potassium chloride SA (K-DUR,KLOR-CON) 20 MEQ tablet Take 1 tablet (20 mEq total) by mouth daily. 08/13/14   Jodelle Gross, NP  simvastatin (ZOCOR) 20 MG tablet Take 1 tablet (20 mg total) by mouth daily. 12/14/11   Jonelle Sidle, MD  traZODone (DESYREL) 50 MG tablet Take 50 mg by mouth at bedtime.    Historical Provider, MD   Triage Vitals: BP 107/62 mmHg  Pulse 93  Temp(Src) 98 F (36.7 C) (Oral)  Resp 18  Wt 121 lb (54.885 kg)  SpO2 100%   Physical Exam  Constitutional: He is oriented to person, place, and time. He appears well-developed and well-nourished.  HENT:  Head: Normocephalic and atraumatic.  Eyes: EOM are normal.  Neck: Normal range of motion.  Cardiovascular: Normal rate, regular rhythm, normal heart sounds and intact distal pulses.   Pulmonary/Chest: Effort normal and breath sounds normal. No respiratory distress.  Abdominal: Soft. He exhibits no distension. There is no tenderness.  Musculoskeletal: Normal range of motion. He exhibits edema.  1+ edema bilaterally.   Neurological: He is alert and oriented to person, place, and time.  Skin: Skin is warm and dry.  Psychiatric: He has a normal mood and affect. Judgment normal.  Nursing note and vitals reviewed.   ED Course  Procedures (including critical care time)  DIAGNOSTIC STUDIES: Oxygen Saturation is 100% on RA, normal by my interpretation.    COORDINATION OF CARE: 11:52 AM-Discussed treatment plan which includes CXR, CBC, BMP with pt at bedside and pt agreed to plan.   Labs Review Labs Reviewed  CBC WITH DIFFERENTIAL/PLATELET - Abnormal; Notable for the following:    RBC 3.71 (*)    Hemoglobin 11.4 (*)    HCT 37.1 (*)    RDW 15.6 (*)    All other components within normal limits  BASIC METABOLIC PANEL - Abnormal; Notable for the following:    CO2 33 (*)    BUN 55 (*)    GFR calc non Af Amer 52 (*)    GFR calc Af Amer 60 (*)    All other  components within normal limits    Imaging Review Ct Chest Wo Contrast  09/09/2014   CLINICAL DATA:  Abnormal right lower lobe. Question infection. Evaluate for masses there was right lower lobe pathology on prior studies  EXAM: CT CHEST WITHOUT CONTRAST  TECHNIQUE: Multidetector CT imaging of the chest was performed following the standard protocol without IV contrast.  COMPARISON:  05/30/2010  FINDINGS: THORACIC INLET/BODY WALL:  No acute abnormality.  MEDIASTINUM:  No cardiomegaly or pericardial effusion. Extensive coronary atherosclerosis, status post CABG. No evidence of acute  vascular abnormality. Calcified mediastinal and right hilar lymph nodes compatible with remote granulomatous disease.  LUNG WINDOWS:  There is a moderate layering right pleural effusion, extending from the base to the apex. Bandlike opacity with volume loss in the right lower lobe consistent with atelectasis. No definite superimposed pneumonia. No visible mass lesion or central airway obstruction. There is clustering of high-density debris/calcification in the atelectatic right lower lobe, nonspecific. Diffuse bronchial wall thickening, likely chronic bronchitis given the appearance previously and history of smoking. No suspicious pulmonary nodules. Granulomatous changes noted at the right apex.  UPPER ABDOMEN:  Cholelithiasis without inflammatory changes. Bilateral upper pole renal cysts. There is extensive granulomatous changes in the spleen.  OSSEOUS:  No acute fracture.  No suspicious lytic or blastic lesions.  IMPRESSION: 1. Moderate layering right pleural effusion with right lower lobe atelectasis. No definite underlying mass or pneumonia. 2. Chronic bronchitis. 3. Cholelithiasis.   Electronically Signed   By: Marnee Spring M.D.   On: 09/09/2014 14:48   Dg Chest Port 1 View  09/09/2014   CLINICAL DATA:  Bilateral feet swelling.  EXAM: PORTABLE CHEST - 1 VIEW  COMPARISON:  07/23/2014.  FINDINGS: Patient is rotated. Heart  size normal. Thoracic aorta is calcified. Calcified lymph nodes are seen in the right hilum. Small right pleural effusion with right lower lobe airspace consolidation. Lungs are otherwise clear. Right shoulder arthroplasty. Degenerative changes in the left shoulder. High-riding left humeral head is indicative of a chronic rotator cuff tear.  IMPRESSION: Right lower lobe airspace consolidation may be due to pneumonia, with a small right pleural effusion. Followup PA and lateral chest X-ray is recommended in 3-4 weeks following trial of antibiotic therapy to ensure resolution and exclude underlying malignancy.   Electronically Signed   By: Leanna Battles M.D.   On: 09/09/2014 12:45   I personally reviewed the imaging tests through PACS system I reviewed available ER/hospitalization records through the EMR    EKG Interpretation   Date/Time:  Monday September 09 2014 11:50:30 EDT Ventricular Rate:  98 PR Interval:    QRS Duration: 105 QT Interval:  373 QTC Calculation: 476 R Axis:   -80 Text Interpretation:  Age not entered, assumed to be  81 years old for  purpose of ECG interpretation Atrial fibrillation Incomplete RBBB and LAFB  Anterior infarct, old No significant change was found Confirmed by Marcelia Petersen   MD, Caryn Bee ([id number]) on 09/09/2014 2:24:58 PM      MDM   Final diagnoses:  Bilateral edema of lower extremity  Atelectasis of right lung   Lower extremity edema.  Primary care follow-up.  No signs of congestive heart failure.  Initially abnormal right chest x-ray.  On my review it appears though he's always had some abnormalities right lower lung.  To better define this a CT scan was performed to evaluate for the possibility of mass.  There appears to be a small pleural effusion and bandlike atelectasis.  No indication for advice.  No symptoms to suggest pneumonia.  Primary care follow-up.   I personally performed the services described in this documentation, which was scribed in my presence.  The recorded information has been reviewed and is accurate.        Azalia Bilis, MD 09/09/14 [phone]

## 2014-09-09 NOTE — Clinical Social Work Note (Signed)
CSW received call from ED RN for possible placement. CSW reviewed chart and discussed with EDP. Information was given to family last hospitalization in April for placement from home which was family's request at the time. Pt now has Medicaid per chart and they have followed up with PCP for appointment to complete paperwork for placement. EDP reports no need for admission at this time and has already instructed family to keep appointment with PCP and continue placement process from home. No need for CSW involvement at this time per EDP. CSW notified ED and will sign off, but can be reconsulted if needed.   Derenda Fennel, LCSW [phone]

## 2014-09-09 NOTE — ED Notes (Signed)
Skin is in good condition, no breakdown noted over sacrum, rectal area or perineum.  No pressure ulcers on heels or elsewhere.

## 2014-09-23 ENCOUNTER — Other Ambulatory Visit: Payer: Self-pay

## 2014-10-24 ENCOUNTER — Other Ambulatory Visit (HOSPITAL_COMMUNITY): Payer: Self-pay | Admitting: Internal Medicine

## 2014-10-24 ENCOUNTER — Ambulatory Visit (HOSPITAL_COMMUNITY)
Admission: RE | Admit: 2014-10-24 | Discharge: 2014-10-24 | Disposition: A | Discharge status: Home / Self Care | Payer: Commercial Managed Care - HMO | Source: Ambulatory Visit | Attending: Internal Medicine | Admitting: Internal Medicine

## 2014-10-24 DIAGNOSIS — R05 Cough: Secondary | ICD-10-CM

## 2014-10-24 DIAGNOSIS — R059 Cough, unspecified: Secondary | ICD-10-CM

## 2014-10-24 DIAGNOSIS — R0989 Other specified symptoms and signs involving the circulatory and respiratory systems: Secondary | ICD-10-CM | POA: Diagnosis not present

## 2014-10-24 DIAGNOSIS — R222 Localized swelling, mass and lump, trunk: Secondary | ICD-10-CM

## 2014-10-24 DIAGNOSIS — J9811 Atelectasis: Secondary | ICD-10-CM | POA: Insufficient documentation

## 2014-10-24 DIAGNOSIS — R0602 Shortness of breath: Secondary | ICD-10-CM | POA: Diagnosis not present

## 2014-10-24 DIAGNOSIS — J90 Pleural effusion, not elsewhere classified: Secondary | ICD-10-CM | POA: Insufficient documentation

## 2014-10-24 DIAGNOSIS — R918 Other nonspecific abnormal finding of lung field: Secondary | ICD-10-CM | POA: Diagnosis not present

## 2014-10-24 DIAGNOSIS — R1310 Dysphagia, unspecified: Secondary | ICD-10-CM

## 2014-10-24 MED ORDER — IOHEXOL 300 MG/ML  SOLN
75.0000 mL | Freq: Once | INTRAMUSCULAR | Status: AC | PRN
  Administered 2014-10-24: 75 mL via INTRAVENOUS

## 2014-10-28 ENCOUNTER — Ambulatory Visit (HOSPITAL_COMMUNITY)
Admission: RE | Admit: 2014-10-28 | Discharge: 2014-10-28 | Disposition: A | Discharge status: Home / Self Care | Payer: Commercial Managed Care - HMO | Source: Ambulatory Visit | Attending: Internal Medicine | Admitting: Internal Medicine

## 2014-10-28 ENCOUNTER — Ambulatory Visit (HOSPITAL_COMMUNITY): Payer: Commercial Managed Care - HMO | Attending: Internal Medicine | Admitting: Speech Pathology

## 2014-10-28 DIAGNOSIS — R1314 Dysphagia, pharyngoesophageal phase: Secondary | ICD-10-CM | POA: Diagnosis present

## 2014-10-28 DIAGNOSIS — R1310 Dysphagia, unspecified: Secondary | ICD-10-CM | POA: Insufficient documentation

## 2014-10-28 DIAGNOSIS — I10 Essential (primary) hypertension: Secondary | ICD-10-CM | POA: Insufficient documentation

## 2014-10-28 DIAGNOSIS — Z87891 Personal history of nicotine dependence: Secondary | ICD-10-CM | POA: Diagnosis not present

## 2014-10-28 NOTE — Therapy (Signed)
[facility] [address] [place], [place], [address] Phone: [phone]   Fax:  [phone]  Modified Barium Swallow  Patient Details  Name: [name] MRN: [id number] Date of Birth: 01-18-1924 Referring Provider:  Pearson Grippe, MD  Encounter Date: 10/28/2014      End of Session - 10/28/14 1439    Visit Number 1   Number of Visits 1   Authorization Type [employer]   SLP Start Time 1315   SLP Stop Time  1345   SLP Time Calculation (min) 30 min   Activity Tolerance Patient tolerated treatment well      Past Medical History  Diagnosis Date  . CAD (coronary artery disease)     CABG 03/2001  . Hyperlipidemia   . DJD (degenerative joint disease)     Neck, shoulder and knees  . BPH (benign prostatic hypertrophy)   . Carotid artery disease   . PAF (paroxysmal atrial fibrillation)     Postoperative  . Essential hypertension   . Tobacco abuse, in remission     Discontinued in 1990  . Paroxysmal atrial flutter   . Dementia     Past Surgical History  Procedure Laterality Date  . Cataract extraction w/ intraocular lens implant Right   . Coronary artery bypass graft  2003  . Shoulder hemi-arthroplasty Right     Dr. Romeo Apple    There were no vitals filed for this visit.  Visit Diagnosis: Dysphagia, pharyngoesophageal phase      Subjective Assessment - 10/28/14 1431    Subjective Pt with wet, congested cough and speech   Special Tests MBSS   Currently in Pain? No/denies             General - 10/28/14 1432    General Information   Date of Onset 10/24/14   Other Pertinent Information 81 yo male referred by Dr. Pearson Grippe for MBSS; resident at [facility]; h/o dementia   Type of Study Other (Comment)  MBSS   Reason for Referral Objectively evaluate swallowing function   Previous Swallow Assessment None on record   Diet Prior to this Study Information not available   Temperature Spikes Noted No   Respiratory Status  Room air   History of Recent Intubation No   Behavior/Cognition Alert;Cooperative;Confused;Requires cueing;Pleasant mood   Oral Cavity - Dentition Poor condition   Oral Motor / Sensory Function Within functional limits   Self-Feeding Abilities Able to feed self   Patient Positioning Upright in chair/Tumbleform   Baseline Vocal Quality Wet   Volitional Cough Weak;Congested   Volitional Swallow Able to elicit   Anatomy Other (Comment)  cervical osteophytes noted C4-5   Pharyngeal Secretions Not observed secondary MBS            Oral Preparation/Oral Phase - 10/28/14 1434    Oral Preparation/Oral Phase   Oral Phase Impaired   Oral - Honey   Oral - Honey Cup Weak ligual manipulation;Lingual pumping;Oral residue   Oral - Nectar   Oral - Nectar Cup Weak ligual manipulation;Oral residue   Oral - Thin   Oral - Thin Cup Weak ligual manipulation;Oral residue   Oral - Solids   Oral - Puree Piecemeal swallowing;Weak ligual manipulation   Oral - Mechanical Soft Weak ligual manipulation;Piecemeal swallowing   Oral - Pill Not tested   Electrical stimulation - Oral Phase   Was Electrical Stimulation Used No          Pharyngeal Phase - 10/28/14 1435  Pharyngeal Phase   Pharyngeal Phase Impaired   Pharyngeal - Honey   Pharyngeal- Honey Cup Swallow initiation at pyriform sinus;Delayed swallow initiation;Reduced pharyngeal peristalsis;Reduced epiglottic inversion;Reduced tongue base retraction;Pharyngeal residue - valleculae;Pharyngeal residue - pyriform;Lateral channel residue;Reduced anterior laryngeal mobility   Pharyngeal - Nectar   Pharyngeal- Nectar Cup Swallow initiation at pyriform sinus;Delayed swallow initiation;Reduced pharyngeal peristalsis;Reduced epiglottic inversion;Reduced airway/laryngeal closure;Reduced tongue base retraction;Penetration/Aspiration during swallow;Penetration/Apiration after swallow;Trace aspiration;Pharyngeal residue - valleculae;Pharyngeal residue -  pyriform;Pharyngeal residue - cp segment;Lateral channel residue;Reduced anterior laryngeal mobility   Pharyngeal - Thin   Pharyngeal- Thin Cup Swallow initiation at pyriform sinus;Delayed swallow initiation;Reduced pharyngeal peristalsis;Reduced epiglottic inversion;Reduced airway/laryngeal closure;Reduced tongue base retraction;Penetration/Aspiration during swallow;Penetration/Apiration after swallow;Trace aspiration;Moderate aspiration;Pharyngeal residue - valleculae;Pharyngeal residue - pyriform;Pharyngeal residue - cp segment;Lateral channel residue;Reduced anterior laryngeal mobility   Pharyngeal - Solids   Pharyngeal- Puree Swallow initiation at vallecula;Delayed swallow initiation;Reduced epiglottic inversion;Reduced pharyngeal peristalsis;Reduced anterior laryngeal mobility;Reduced tongue base retraction;Pharyngeal residue - valleculae;Pharyngeal residue - pyriform;Lateral channel residue   Pharyngeal- Mechanical Soft Swallow initiation at vallecula;Delayed swallow initiation;Reduced pharyngeal peristalsis;Reduced epiglottic inversion;Reduced anterior laryngeal mobility;Reduced tongue base retraction;Pharyngeal residue - valleculae;Pharyngeal residue - pyriform;Lateral channel residue   Electrical Stimulation - Pharyngeal Phase   Was Electrical Stimulation Used No          Cricopharyngeal Phase - 2014-11-03 1438    Cervical Esophageal Phase   Cervical Esophageal Phase Within functional limits                  Plan - November 03, 2014 1439    Clinical Impression Statement Pt presents with functionally moderate to severe oropharyngeal phase dysphagia characterized by weak lingual manipulation, impaired mastication, inefficient AP transit and bolus propulsion with premature spillage over base of tongue, piecemeal deglutition, delay in swallow initiation, decreased tonge base retraction resulting in decreased pharyngeal pressure which is also likely negatively impacted by osteophytes at C4-5  which impinge into pharyngeal space. Pt with penetration and aspiration of thins and nectars during and after the swallow to which pt was inconsistently aware. Silent aspiration occurred after the swallow from residuals- pt's vocal quality wet, however no attempt to independently clear. Pt with significant residuals post swallow in pharynx (in valleculae, lateral channels, and pyriforms) with reduced ability to clear despite cueing him for repeat/dry swallows. Pt tolerated honey-thick liquids best, but still had residuals post swallow. Consider downgrading diet to D1/puree (SLP to upgrade to mech soft clinically if appropriate over course of meal) and honey-thick liquids. Cue pt to "swallow hard" and repeat swallow 2-3x for each bite/sip. Clear throat/hard cough periodically and sit upright after meals. Pt is at risk for aspiration despite diet modifications due to dementia and significant residuals in pharynx post swallow.           G-Codes - 11-03-14 1443    Functional Assessment Tool Used MBSS; clinical judgement   Functional Limitations Swallowing   Swallow Current Status (Z6109) At least 60 percent but less than 80 percent impaired, limited or restricted   Swallow Goal Status (U0454) At least 60 percent but less than 80 percent impaired, limited or restricted   Swallow Discharge Status [phone]) At least 60 percent but less than 80 percent impaired, limited or restricted          Recommendations/Treatment - 11-03-14 1438    Swallow Evaluation Recommendations   SLP Diet Recommendations Dysphagia 1 (Puree);Honey   Liquid Administration via Cup   Medication Administration Whole meds with liquid   Supervision Patient able to self feed;Full supervision/cueing for  compensatory strategies   Compensations Multiple dry swallows after each bite/sip;Hard cough after swallow;Effortful swallow   Postural Changes Remain semi-upright after after feeds/meals (Comment);Seated upright at 90 degrees           Prognosis - 10/28/14 1438    Prognosis   Prognosis for Safe Diet Advancement Guarded   Barriers to Reach Goals Severity of deficits   Individuals Consulted   Consulted and Agree with Results and Recommendations Patient;Patient unable/family or caregiver not available   Report Sent to  Referring physician;Primary SLP      Problem List Patient Active Problem List   Diagnosis Date Noted  . SOB (shortness of breath)   . Elevated troponin   . Shortness of breath 07/20/2014  . Acute CHF 07/20/2014  . Coronary atherosclerosis of native coronary artery   . Hyperlipidemia   . PAF (paroxysmal atrial fibrillation)   . Hypertension   . Cerebrovascular disease 01/27/2005   Thank you,  Havery Moros, CCC-SLP [phone]  [facility] 10/28/2014, 2:44 PM  [facility] [address] [place], [place], [address] Phone: [phone]   Fax:  [phone]

## 2014-11-02 ENCOUNTER — Inpatient Hospital Stay (HOSPITAL_COMMUNITY)
Admission: EM | Admit: 2014-11-02 | Discharge: 2014-11-05 | DRG: 193 | Disposition: A | Discharge status: Skilled Nursing Facility | Payer: Commercial Managed Care - HMO | Attending: Internal Medicine | Admitting: Internal Medicine

## 2014-11-02 ENCOUNTER — Encounter (HOSPITAL_COMMUNITY): Payer: Self-pay

## 2014-11-02 ENCOUNTER — Emergency Department (HOSPITAL_COMMUNITY): Payer: Commercial Managed Care - HMO

## 2014-11-02 DIAGNOSIS — M170 Bilateral primary osteoarthritis of knee: Secondary | ICD-10-CM | POA: Diagnosis present

## 2014-11-02 DIAGNOSIS — Z79899 Other long term (current) drug therapy: Secondary | ICD-10-CM | POA: Diagnosis not present

## 2014-11-02 DIAGNOSIS — I2510 Atherosclerotic heart disease of native coronary artery without angina pectoris: Secondary | ICD-10-CM | POA: Diagnosis present

## 2014-11-02 DIAGNOSIS — E785 Hyperlipidemia, unspecified: Secondary | ICD-10-CM | POA: Diagnosis present

## 2014-11-02 DIAGNOSIS — I4891 Unspecified atrial fibrillation: Secondary | ICD-10-CM | POA: Diagnosis present

## 2014-11-02 DIAGNOSIS — Z96611 Presence of right artificial shoulder joint: Secondary | ICD-10-CM | POA: Diagnosis present

## 2014-11-02 DIAGNOSIS — R0602 Shortness of breath: Secondary | ICD-10-CM | POA: Diagnosis present

## 2014-11-02 DIAGNOSIS — I10 Essential (primary) hypertension: Secondary | ICD-10-CM | POA: Diagnosis present

## 2014-11-02 DIAGNOSIS — Z951 Presence of aortocoronary bypass graft: Secondary | ICD-10-CM | POA: Diagnosis not present

## 2014-11-02 DIAGNOSIS — J9691 Respiratory failure, unspecified with hypoxia: Secondary | ICD-10-CM | POA: Diagnosis present

## 2014-11-02 DIAGNOSIS — Z7982 Long term (current) use of aspirin: Secondary | ICD-10-CM

## 2014-11-02 DIAGNOSIS — H5442 Blindness, left eye, normal vision right eye: Secondary | ICD-10-CM | POA: Diagnosis present

## 2014-11-02 DIAGNOSIS — E43 Unspecified severe protein-calorie malnutrition: Secondary | ICD-10-CM

## 2014-11-02 DIAGNOSIS — R1312 Dysphagia, oropharyngeal phase: Secondary | ICD-10-CM | POA: Diagnosis present

## 2014-11-02 DIAGNOSIS — I779 Disorder of arteries and arterioles, unspecified: Secondary | ICD-10-CM | POA: Diagnosis present

## 2014-11-02 DIAGNOSIS — I482 Chronic atrial fibrillation: Secondary | ICD-10-CM | POA: Diagnosis not present

## 2014-11-02 DIAGNOSIS — Y95 Nosocomial condition: Secondary | ICD-10-CM | POA: Diagnosis present

## 2014-11-02 DIAGNOSIS — I480 Paroxysmal atrial fibrillation: Secondary | ICD-10-CM | POA: Diagnosis present

## 2014-11-02 DIAGNOSIS — F0391 Unspecified dementia with behavioral disturbance: Secondary | ICD-10-CM | POA: Diagnosis present

## 2014-11-02 DIAGNOSIS — A419 Sepsis, unspecified organism: Secondary | ICD-10-CM

## 2014-11-02 DIAGNOSIS — R4182 Altered mental status, unspecified: Secondary | ICD-10-CM

## 2014-11-02 DIAGNOSIS — M47892 Other spondylosis, cervical region: Secondary | ICD-10-CM | POA: Diagnosis present

## 2014-11-02 DIAGNOSIS — Z681 Body mass index (BMI) 19 or less, adult: Secondary | ICD-10-CM

## 2014-11-02 DIAGNOSIS — Z66 Do not resuscitate: Secondary | ICD-10-CM | POA: Diagnosis present

## 2014-11-02 DIAGNOSIS — J189 Pneumonia, unspecified organism: Principal | ICD-10-CM | POA: Diagnosis present

## 2014-11-02 DIAGNOSIS — I509 Heart failure, unspecified: Secondary | ICD-10-CM | POA: Diagnosis present

## 2014-11-02 DIAGNOSIS — N400 Enlarged prostate without lower urinary tract symptoms: Secondary | ICD-10-CM | POA: Diagnosis present

## 2014-11-02 DIAGNOSIS — Z87891 Personal history of nicotine dependence: Secondary | ICD-10-CM | POA: Diagnosis not present

## 2014-11-02 HISTORY — DX: Unspecified abnormalities of gait and mobility: R26.9

## 2014-11-02 HISTORY — DX: Constipation, unspecified: K59.00

## 2014-11-02 HISTORY — DX: Acute ischemic heart disease, unspecified: I24.9

## 2014-11-02 HISTORY — DX: Atelectasis: J98.11

## 2014-11-02 HISTORY — DX: Dyspnea, unspecified: R06.00

## 2014-11-02 HISTORY — DX: Heart failure, unspecified: I50.9

## 2014-11-02 HISTORY — DX: Blindness, one eye, unspecified eye: H54.40

## 2014-11-02 HISTORY — DX: Dysphagia, unspecified: R13.10

## 2014-11-02 HISTORY — DX: Pneumonia, unspecified organism: J18.9

## 2014-11-02 LAB — BASIC METABOLIC PANEL
Anion gap: 6 (ref 5–15)
BUN: 35 mg/dL — ABNORMAL HIGH (ref 6–20)
CALCIUM: 8.8 mg/dL — AB (ref 8.9–10.3)
CO2: 34 mmol/L — ABNORMAL HIGH (ref 22–32)
CREATININE: 1.25 mg/dL — AB (ref 0.61–1.24)
Chloride: 107 mmol/L (ref 101–111)
GFR calc Af Amer: 57 mL/min — ABNORMAL LOW (ref >60)
GFR calc non Af Amer: 49 mL/min — ABNORMAL LOW (ref >60)
Glucose, Bld: 104 mg/dL — ABNORMAL HIGH (ref 65–99)
Potassium: 3.9 mmol/L (ref 3.5–5.1)
Sodium: 147 mmol/L — ABNORMAL HIGH (ref 135–145)

## 2014-11-02 LAB — BLOOD GAS, ARTERIAL
ACID-BASE EXCESS: 5.9 mmol/L — AB (ref 0.0–2.0)
Bicarbonate: 30.5 mEq/L — ABNORMAL HIGH (ref 20.0–24.0)
Drawn by: 274071
O2 Content: 4 L/min
O2 Saturation: 99 %
PCO2 ART: 49.9 mmHg — AB (ref 35.0–45.0)
TCO2: 28.4 mmol/L (ref 0–100)
pH, Arterial: 7.404 (ref 7.350–7.450)
pO2, Arterial: 165 mmHg — ABNORMAL HIGH (ref 80.0–100.0)

## 2014-11-02 LAB — CBC WITH DIFFERENTIAL/PLATELET
BASOS ABS: 0 10*3/uL (ref 0.0–0.1)
BASOS PCT: 0 % (ref 0–1)
EOS ABS: 0 10*3/uL (ref 0.0–0.7)
Eosinophils Relative: 1 % (ref 0–5)
HCT: 40.7 % (ref 39.0–52.0)
Hemoglobin: 12.2 g/dL — ABNORMAL LOW (ref 13.0–17.0)
Lymphocytes Relative: 17 % (ref 12–46)
Lymphs Abs: 0.9 10*3/uL (ref 0.7–4.0)
MCH: 30 pg (ref 26.0–34.0)
MCHC: 30 g/dL (ref 30.0–36.0)
MCV: 100 fL (ref 78.0–100.0)
MONO ABS: 0.4 10*3/uL (ref 0.1–1.0)
Monocytes Relative: 6 % (ref 3–12)
Neutro Abs: 4.3 10*3/uL (ref 1.7–7.7)
Neutrophils Relative %: 76 % (ref 43–77)
Platelets: 199 10*3/uL (ref 150–400)
RBC: 4.07 MIL/uL — ABNORMAL LOW (ref 4.22–5.81)
RDW: 15.7 % — ABNORMAL HIGH (ref 11.5–15.5)
WBC: 5.6 10*3/uL (ref 4.0–10.5)

## 2014-11-02 LAB — HEPATIC FUNCTION PANEL
ALBUMIN: 2.7 g/dL — AB (ref 3.5–5.0)
ALT: 23 U/L (ref 17–63)
AST: 43 U/L — ABNORMAL HIGH (ref 15–41)
Alkaline Phosphatase: 71 U/L (ref 38–126)
BILIRUBIN INDIRECT: 0.3 mg/dL (ref 0.3–0.9)
Bilirubin, Direct: 0.1 mg/dL (ref 0.1–0.5)
Total Bilirubin: 0.4 mg/dL (ref 0.3–1.2)
Total Protein: 6.8 g/dL (ref 6.5–8.1)

## 2014-11-02 LAB — PROTIME-INR
INR: 1.1 (ref 0.00–1.49)
PROTHROMBIN TIME: 14.4 s (ref 11.6–15.2)

## 2014-11-02 LAB — TROPONIN I
TROPONIN I: 0.05 ng/mL — AB (ref <0.031)
Troponin I: 0.05 ng/mL — ABNORMAL HIGH (ref <0.031)
Troponin I: 0.05 ng/mL — ABNORMAL HIGH (ref <0.031)

## 2014-11-02 LAB — MRSA PCR SCREENING: MRSA BY PCR: NEGATIVE

## 2014-11-02 LAB — TSH: TSH: 5.985 u[IU]/mL — AB (ref 0.350–4.500)

## 2014-11-02 LAB — LIPASE, BLOOD: Lipase: 33 U/L (ref 22–51)

## 2014-11-02 LAB — BRAIN NATRIURETIC PEPTIDE: B Natriuretic Peptide: 591 pg/mL — ABNORMAL HIGH (ref 0.0–100.0)

## 2014-11-02 LAB — I-STAT CG4 LACTIC ACID, ED: Lactic Acid, Venous: 2.03 mmol/L (ref 0.5–2.0)

## 2014-11-02 MED ORDER — BRIMONIDINE TARTRATE 0.2 % OP SOLN
1.0000 [drp] | Freq: Every day | OPHTHALMIC | Status: DC
  Administered 2014-11-02 – 2014-11-04 (×3): 1 [drp] via OPHTHALMIC
  Filled 2014-11-02: qty 5

## 2014-11-02 MED ORDER — VANCOMYCIN HCL IN DEXTROSE 750-5 MG/150ML-% IV SOLN
750.0000 mg | INTRAVENOUS | Status: DC
  Administered 2014-11-03: 750 mg via INTRAVENOUS
  Filled 2014-11-02 (×2): qty 150

## 2014-11-02 MED ORDER — DOCUSATE SODIUM 100 MG PO CAPS
100.0000 mg | ORAL_CAPSULE | Freq: Two times a day (BID) | ORAL | Status: DC
  Administered 2014-11-02 – 2014-11-05 (×7): 100 mg via ORAL
  Filled 2014-11-02 (×6): qty 1

## 2014-11-02 MED ORDER — CETYLPYRIDINIUM CHLORIDE 0.05 % MT LIQD
7.0000 mL | Freq: Two times a day (BID) | OROMUCOSAL | Status: DC
  Administered 2014-11-02 – 2014-11-05 (×7): 7 mL via OROMUCOSAL

## 2014-11-02 MED ORDER — ENOXAPARIN SODIUM 40 MG/0.4ML ~~LOC~~ SOLN
40.0000 mg | SUBCUTANEOUS | Status: DC
  Administered 2014-11-02 – 2014-11-04 (×3): 40 mg via SUBCUTANEOUS
  Filled 2014-11-02 (×4): qty 0.4

## 2014-11-02 MED ORDER — PIPERACILLIN-TAZOBACTAM 3.375 G IVPB
3.3750 g | Freq: Three times a day (TID) | INTRAVENOUS | Status: DC
  Administered 2014-11-02 – 2014-11-04 (×5): 3.375 g via INTRAVENOUS
  Filled 2014-11-02 (×6): qty 50

## 2014-11-02 MED ORDER — POLYETHYLENE GLYCOL 3350 17 G PO PACK: 17.0000 g | PACK | Freq: Every day | ORAL | Status: DC | PRN

## 2014-11-02 MED ORDER — ONDANSETRON HCL 4 MG PO TABS: 4.0000 mg | ORAL_TABLET | Freq: Four times a day (QID) | ORAL | Status: DC | PRN

## 2014-11-02 MED ORDER — STARCH (THICKENING) PO POWD
ORAL | Status: DC | PRN
  Filled 2014-11-02: qty 227

## 2014-11-02 MED ORDER — VANCOMYCIN HCL IN DEXTROSE 1-5 GM/200ML-% IV SOLN
1000.0000 mg | Freq: Once | INTRAVENOUS | Status: AC
  Administered 2014-11-02: 1000 mg via INTRAVENOUS
  Filled 2014-11-02: qty 200

## 2014-11-02 MED ORDER — OXYCODONE HCL 5 MG PO TABS
5.0000 mg | ORAL_TABLET | ORAL | Status: DC | PRN
  Administered 2014-11-02 – 2014-11-04 (×4): 5 mg via ORAL
  Filled 2014-11-02 (×4): qty 1

## 2014-11-02 MED ORDER — ATORVASTATIN CALCIUM 10 MG PO TABS
10.0000 mg | ORAL_TABLET | Freq: Every day | ORAL | Status: DC
  Administered 2014-11-02 – 2014-11-05 (×4): 10 mg via ORAL
  Filled 2014-11-02 (×4): qty 1

## 2014-11-02 MED ORDER — ACETAMINOPHEN 325 MG PO TABS: 650.0000 mg | ORAL_TABLET | Freq: Four times a day (QID) | ORAL | Status: DC | PRN

## 2014-11-02 MED ORDER — ONDANSETRON HCL 4 MG/2ML IJ SOLN: 4.0000 mg | Freq: Four times a day (QID) | INTRAMUSCULAR | Status: DC | PRN

## 2014-11-02 MED ORDER — SODIUM CHLORIDE 0.9 % IV SOLN
INTRAVENOUS | Status: DC
  Administered 2014-11-02: 14:00:00 via INTRAVENOUS

## 2014-11-02 MED ORDER — BRIMONIDINE TARTRATE-TIMOLOL 0.2-0.5 % OP SOLN: 1.0000 [drp] | Freq: Every day | OPHTHALMIC | Status: DC

## 2014-11-02 MED ORDER — PIPERACILLIN-TAZOBACTAM 3.375 G IVPB 30 MIN
3.3750 g | Freq: Once | INTRAVENOUS | Status: AC
  Administered 2014-11-02: 3.375 g via INTRAVENOUS
  Filled 2014-11-02: qty 50

## 2014-11-02 MED ORDER — DEXTROSE 5 % IV SOLN
5.0000 mg/h | INTRAVENOUS | Status: DC
  Administered 2014-11-02: 5 mg/h via INTRAVENOUS
  Administered 2014-11-02 – 2014-11-03 (×2): 15 mg/h via INTRAVENOUS

## 2014-11-02 MED ORDER — ASPIRIN EC 81 MG PO TBEC
81.0000 mg | DELAYED_RELEASE_TABLET | Freq: Every day | ORAL | Status: DC
  Administered 2014-11-02 – 2014-11-05 (×4): 81 mg via ORAL
  Filled 2014-11-02 (×4): qty 1

## 2014-11-02 MED ORDER — LATANOPROST 0.005 % OP SOLN
1.0000 [drp] | Freq: Every day | OPHTHALMIC | Status: DC
  Administered 2014-11-02 – 2014-11-05 (×4): 1 [drp] via OPHTHALMIC
  Filled 2014-11-02: qty 2.5

## 2014-11-02 MED ORDER — TIMOLOL MALEATE 0.5 % OP SOLN
1.0000 [drp] | Freq: Every day | OPHTHALMIC | Status: DC
  Administered 2014-11-02 – 2014-11-04 (×3): 1 [drp] via OPHTHALMIC
  Filled 2014-11-02: qty 5

## 2014-11-02 MED ORDER — SODIUM CHLORIDE 0.9 % IV BOLUS (SEPSIS)
250.0000 mL | Freq: Once | INTRAVENOUS | Status: AC
  Administered 2014-11-02: 250 mL via INTRAVENOUS

## 2014-11-02 MED ORDER — SODIUM CHLORIDE 0.9 % IJ SOLN
3.0000 mL | Freq: Two times a day (BID) | INTRAMUSCULAR | Status: DC
  Administered 2014-11-02 – 2014-11-05 (×7): 3 mL via INTRAVENOUS

## 2014-11-02 MED ORDER — SODIUM CHLORIDE 0.9 % IV BOLUS (SEPSIS)
1000.0000 mL | INTRAVENOUS | Status: AC
  Administered 2014-11-02 (×2): 1000 mL via INTRAVENOUS

## 2014-11-02 MED ORDER — ZOLPIDEM TARTRATE 5 MG PO TABS
5.0000 mg | ORAL_TABLET | Freq: Every evening | ORAL | Status: DC | PRN
  Administered 2014-11-02: 5 mg via ORAL
  Filled 2014-11-02: qty 1

## 2014-11-02 NOTE — Progress Notes (Addendum)
Pt trasnferred to unit from ED. Pt is alert and oriented to self. Pt has no complaints at this time. He is still in a-fib with RVR cardizem drip is on. Family at bedside.

## 2014-11-02 NOTE — Progress Notes (Signed)
ANTIBIOTIC CONSULT NOTE - FOLLOW UP  Pharmacy Consult for Vancomycin and Zosyn  Indication: rule out pneumonia / HCAP  No Known Allergies  Patient Measurements: Weight: 128 lb 9.6 oz (58.333 kg) (weighed at facility last night)  Vital Signs: Temp: 97.5 F (36.4 C) (08/06 1200) Temp Source: Oral (08/06 1200) BP: 123/97 mmHg (08/06 1300) Pulse Rate: 142 (08/06 1300) Intake/Output from previous day:   Intake/Output from this shift:    Labs:  Recent Labs  11/02/14 1015  WBC 5.6  HGB 12.2*  PLT 199  CREATININE 1.25*   Estimated Creatinine Clearance: 33 mL/min (by C-G formula based on Cr of 1.25). No results for input(s): VANCOTROUGH, VANCOPEAK, VANCORANDOM, GENTTROUGH, GENTPEAK, GENTRANDOM, TOBRATROUGH, TOBRAPEAK, TOBRARND, AMIKACINPEAK, AMIKACINTROU, AMIKACIN in the last 72 hours.   Microbiology: No results found for this or any previous visit (from the past 720 hour(s)).  Anti-infectives    Start     Dose/Rate Route Frequency Ordered Stop   11/03/14 1200  vancomycin (VANCOCIN) IVPB 750 mg/150 ml premix     750 mg 150 mL/hr over 60 Minutes Intravenous Every 24 hours 11/02/14 1416     11/02/14 2000  piperacillin-tazobactam (ZOSYN) IVPB 3.375 g     3.375 g 12.5 mL/hr over 240 Minutes Intravenous Every 8 hours 11/02/14 1416     11/02/14 1115  piperacillin-tazobactam (ZOSYN) IVPB 3.375 g     3.375 g 100 mL/hr over 30 Minutes Intravenous  Once 11/02/14 1102 11/02/14 1150   11/02/14 1115  vancomycin (VANCOCIN) IVPB 1000 mg/200 mL premix     1,000 mg 200 mL/hr over 60 Minutes Intravenous  Once 11/02/14 1102 11/02/14 1221      Assessment: Okay for Protocol, received initial doses in ED.  Goal of Therapy:  Vancomycin trough level 15-20 mcg/ml  Eradicate infection.   Plan:  Vancomycin  IV every 24 hours. Measure antibiotic drug levels at steady state Follow up culture results Zosyn 3.375gm IV every 8 hours. Follow-up micro data, labs, vitals.   Lamonte Richer R 11/02/2014,2:16 PM

## 2014-11-02 NOTE — ED Notes (Addendum)
Pt from [facility], staff reports pt has been diagnosed with pneumonia and has had a change in mental status today.  Reports yesterday pt was up in wheelchair, able to feed self.  Today pt more sluggish and drowsy.  Staff reports pt was put on bipap last night and this morning had decreased responsiveness when bipap removed.  Reported 02 sats were in the 70's on 4 liters.  EMS reports pt's hands are cold to touch and nail beds cyanotic.  Pt on IM rocephin.  EMS reports when they arrived pt was in afib with rate of 160's.

## 2014-11-02 NOTE — ED Notes (Signed)
MD notified of Lactic Acid 

## 2014-11-02 NOTE — Progress Notes (Signed)
Pt arrived on a non-rebreather. O2 sats 100%, pt taken off non-rebreather and placed on 4L nasal cannula.  O2 sat still 100%.Pt tolerating well at this time. RT will continue to monitor.

## 2014-11-02 NOTE — ED Provider Notes (Signed)
CSN: [id number]     Arrival date & time 11/02/14  0959 History  This chart was scribed for Vanetta Mulders, MD by Andrew Au, ED Scribe. This patient was seen in room APA01/APA01 and the patient's care was started at 10:19 AM.   Chief Complaint  Patient presents with  . Altered Mental Status  . Respiratory Distress   The history is limited by the condition of the patient.     HPI Comments:   THIS IS A LEVEL 5 CAVEAT.  [name] is a 81 y.o. male who present to the Emergency Department complaining of AMS. Pt is from [facility]. He is a DNR. He was treated for pneumonia for about 1 week, treated with rocephin. He was on cpap or bipap last night but when they took it off this morning pt was not responsive. Per EMS, RA stat was 70% on 4L. with a fast HR of 160. His is currently on 4L of O2 with RA stat 100%.  Past Medical History  Diagnosis Date  . CAD (coronary artery disease)     CABG 03/2001  . Hyperlipidemia   . DJD (degenerative joint disease)     Neck, shoulder and knees  . BPH (benign prostatic hypertrophy)   . Carotid artery disease   . PAF (paroxysmal atrial fibrillation)     Postoperative  . Essential hypertension   . Tobacco abuse, in remission     Discontinued in 1990  . Paroxysmal atrial flutter   . Dementia   . Pneumonia   . Dementia   . Dyspnea   . Constipation   . Blind left eye   . CHF (congestive heart failure)   . Acute ischemic heart disease   . Dysphagia   . Gait difficulty   . Atelectasis    Past Surgical History  Procedure Laterality Date  . Cataract extraction w/ intraocular lens implant Right   . Coronary artery bypass graft  2003  . Shoulder hemi-arthroplasty Right     Dr. Romeo Apple   Family History  Problem Relation Age of Onset  . Sudden death Father 16   History  Substance Use Topics  . Smoking status: Former Smoker    Types: Cigarettes  . Smokeless tobacco: Not on file  . Alcohol Use: No    Review of Systems  Unable to perform  ROS: Mental status change    Allergies  Review of patient's allergies indicates no known allergies.  Home Medications   Prior to Admission medications   Medication Sig Start Date End Date Taking? Authorizing Provider  aspirin 81 MG tablet Take 81 mg by mouth daily.     Historical Provider, MD  bimatoprost (LUMIGAN) 0.01 % SOLN Place 1 drop into the right eye at bedtime.    Historical Provider, MD  brimonidine-timolol (COMBIGAN) 0.2-0.5 % ophthalmic solution Place 1 drop into the right eye daily at 6 PM.     Historical Provider, MD  diltiazem (CARDIZEM CD) 120 MG 24 hr capsule Take 1 capsule (120 mg total) by mouth daily. 07/23/14   Maryann Mikhail, DO  docusate sodium (COLACE) 100 MG capsule Take 100 mg by mouth 2 (two) times daily.    Historical Provider, MD  furosemide (LASIX) 20 MG tablet Take 1.5 tablets (30 mg total) by mouth daily. 08/13/14   Jodelle Gross, NP  levofloxacin (LEVAQUIN) 750 MG tablet Take 1 tablet (750 mg total) by mouth every other day. Patient not taking: Reported on 08/07/2014 07/23/14   Edsel Petrin,  DO  lisinopril-hydrochlorothiazide (PRINZIDE,ZESTORETIC) 20-12.5 MG per tablet Take 1 tablet by mouth daily. 08/06/14   Jodelle Gross, NP  LORazepam (ATIVAN) 0.5 MG tablet Take 0.5 tablets (0.25 mg total) by mouth at bedtime as needed for anxiety or sleep. 07/23/14   Maryann Mikhail, DO  metoprolol (LOPRESSOR) 50 MG tablet Take 1 tablet (50 mg total) by mouth once. 08/06/14   Jodelle Gross, NP  oxyCODONE-acetaminophen (PERCOCET/ROXICET) 5-325 MG per tablet Take 1 tablet by mouth every 6 (six) hours as needed for severe pain.    Historical Provider, MD  potassium chloride SA (K-DUR,KLOR-CON) 20 MEQ tablet Take 1 tablet (20 mEq total) by mouth daily. 08/13/14   Jodelle Gross, NP  simvastatin (ZOCOR) 20 MG tablet Take 1 tablet (20 mg total) by mouth daily. 12/14/11   Jonelle Sidle, MD  traZODone (DESYREL) 50 MG tablet Take 50 mg by mouth at bedtime.     Historical Provider, MD   BP 155/109 mmHg  Pulse 146  Temp(Src) 97.5 F (36.4 C) (Oral)  Resp 16  SpO2 100% Physical Exam  Constitutional: He is oriented to person, place, and time. He appears well-developed and well-nourished. No distress.  HENT:  Head: Normocephalic and atraumatic.  Eyes: Conjunctivae and EOM are normal. No scleral icterus.  Has cataract surgery on both sides. Irregular pupils  Neck: Neck supple.  Cardiovascular: An irregular rhythm present. Tachycardia present.   Pulmonary/Chest: Effort normal and breath sounds normal. He has no wheezes. He has no rales.  Abdominal: Bowel sounds are normal. There is no tenderness.  Musculoskeletal: Normal range of motion. He exhibits no edema.  Moves fingers on both sides. Moves left foot but not right.  Neurological: He is alert and oriented to person, place, and time.  Skin: Skin is warm and dry.  Psychiatric: He has a normal mood and affect. His behavior is normal.  Nursing note and vitals reviewed.   ED Course  Procedures (including critical care time) DIAGNOSTIC STUDIES: Oxygen Saturation is 100% on RA, normal by my interpretation.    COORDINATION OF CARE:   Labs Review Labs Reviewed  CBC WITH DIFFERENTIAL/PLATELET - Abnormal; Notable for the following:    RBC 4.07 (*)    Hemoglobin 12.2 (*)    RDW 15.7 (*)    All other components within normal limits  BASIC METABOLIC PANEL - Abnormal; Notable for the following:    Sodium 147 (*)    CO2 34 (*)    Glucose, Bld 104 (*)    BUN 35 (*)    Creatinine, Ser 1.25 (*)    Calcium 8.8 (*)    GFR calc non Af Amer 49 (*)    GFR calc Af Amer 57 (*)    All other components within normal limits  TROPONIN I - Abnormal; Notable for the following:    Troponin I 0.05 (*)    All other components within normal limits  HEPATIC FUNCTION PANEL - Abnormal; Notable for the following:    Albumin 2.7 (*)    AST 43 (*)    All other components within normal limits  BRAIN NATRIURETIC  PEPTIDE - Abnormal; Notable for the following:    B Natriuretic Peptide 591.0 (*)    All other components within normal limits  BLOOD GAS, ARTERIAL - Abnormal; Notable for the following:    pCO2 arterial 49.9 (*)    pO2, Arterial 165 (*)    Bicarbonate 30.5 (*)    Acid-Base Excess 5.9 (*)  All other components within normal limits  I-STAT CG4 LACTIC ACID, ED - Abnormal; Notable for the following:    Lactic Acid, Venous 2.03 (*)    All other components within normal limits  CULTURE, BLOOD (ROUTINE X 2)  CULTURE, BLOOD (ROUTINE X 2)  URINE CULTURE  LIPASE, BLOOD  PROTIME-INR  I-STAT CG4 LACTIC ACID, ED   Results for orders placed or performed during the hospital encounter of 11/02/14  CBC with Differential  Result Value Ref Range   WBC 5.6 4.0 - 10.5 K/uL   RBC 4.07 (L) 4.22 - 5.81 MIL/uL   Hemoglobin 12.2 (L) 13.0 - 17.0 g/dL   HCT 16.1 09.6 - 04.5 %   MCV 100.0 78.0 - 100.0 fL   MCH 30.0 26.0 - 34.0 pg   MCHC 30.0 30.0 - 36.0 g/dL   RDW 40.9 (H) 81.1 - 91.4 %   Platelets 199 150 - 400 K/uL   Neutrophils Relative % 76 43 - 77 %   Neutro Abs 4.3 1.7 - 7.7 K/uL   Lymphocytes Relative 17 12 - 46 %   Lymphs Abs 0.9 0.7 - 4.0 K/uL   Monocytes Relative 6 3 - 12 %   Monocytes Absolute 0.4 0.1 - 1.0 K/uL   Eosinophils Relative 1 0 - 5 %   Eosinophils Absolute 0.0 0.0 - 0.7 K/uL   Basophils Relative 0 0 - 1 %   Basophils Absolute 0.0 0.0 - 0.1 K/uL  Basic metabolic panel  Result Value Ref Range   Sodium 147 (H) 135 - 145 mmol/L   Potassium 3.9 3.5 - 5.1 mmol/L   Chloride 107 101 - 111 mmol/L   CO2 34 (H) 22 - 32 mmol/L   Glucose, Bld 104 (H) 65 - 99 mg/dL   BUN 35 (H) 6 - 20 mg/dL   Creatinine, Ser 7.82 (H) 0.61 - 1.24 mg/dL   Calcium 8.8 (L) 8.9 - 10.3 mg/dL   GFR calc non Af Amer 49 (L) >60 mL/min   GFR calc Af Amer 57 (L) >60 mL/min   Anion gap 6 5 - 15  Troponin I  Result Value Ref Range   Troponin I 0.05 (H) <0.031 ng/mL  Hepatic function panel  Result Value Ref  Range   Total Protein 6.8 6.5 - 8.1 g/dL   Albumin 2.7 (L) 3.5 - 5.0 g/dL   AST 43 (H) 15 - 41 U/L   ALT 23 17 - 63 U/L   Alkaline Phosphatase 71 38 - 126 U/L   Total Bilirubin 0.4 0.3 - 1.2 mg/dL   Bilirubin, Direct 0.1 0.1 - 0.5 mg/dL   Indirect Bilirubin 0.3 0.3 - 0.9 mg/dL  Lipase, blood  Result Value Ref Range   Lipase 33 22 - 51 U/L  Protime-INR  Result Value Ref Range   Prothrombin Time 14.4 11.6 - 15.2 seconds   INR 1.10 0.00 - 1.49  Brain natriuretic peptide  Result Value Ref Range   B Natriuretic Peptide 591.0 (H) 0.0 - 100.0 pg/mL  Blood gas, arterial ([facility] & [facility] ONLY)  Result Value Ref Range   O2 Content 4.0 L/min   Delivery systems NASAL CANNULA    pH, Arterial 7.404 7.350 - 7.450   pCO2 arterial 49.9 (H) 35.0 - 45.0 mmHg   pO2, Arterial 165 (H) 80.0 - 100.0 mmHg   Bicarbonate 30.5 (H) 20.0 - 24.0 mEq/L   TCO2 28.4 0 - 100 mmol/L   Acid-Base Excess 5.9 (H) 0.0 - 2.0 mmol/L   O2  Saturation 99.0 %   Collection site RIGHT BRACHIAL    Drawn by [phone]    Sample type ARTERIAL DRAW    Allens test (pass/fail) PASS PASS  I-Stat CG4 Lactic Acid, ED  Result Value Ref Range   Lactic Acid, Venous 2.03 (HH) 0.5 - 2.0 mmol/L   Comment NOTIFIED PHYSICIAN      Imaging Review Dg Chest Portable 1 View  11/02/2014   CLINICAL DATA:  81 year old male with pneumonia and shortness of breath.  EXAM: PORTABLE CHEST - 1 VIEW  COMPARISON:  Chest x-ray 10/24/2014.  FINDINGS: Lung volumes have decreased slightly. Worsening bibasilar opacities may reflect areas of increasing atelectasis and/or consolidation, particularly in the right lung base. Small left and moderate right-sided pleural effusions. Mild cephalization of the pulmonary vasculature, without frank pulmonary edema. Heart size is borderline enlarged. Upper mediastinal contours are distorted by patient positioning. Atherosclerosis in the thoracic aorta. Status post median sternotomy for CABG. Right shoulder arthroplasty.  IMPRESSION:  1. Worsening aeration in the lung bases bilaterally, with increasing atelectasis and/or consolidation, particularly in the right lower lobe, as well as moderate right and small left pleural effusions. 2. Atherosclerosis.   Electronically Signed   By: Trudie Reed M.D.   On: 11/02/2014 11:09     EKG Interpretation   Date/Time:  Saturday November 02 2014 10:10:43 EDT Ventricular Rate:  143 PR Interval:    QRS Duration: 109 QT Interval:  339 QTC Calculation: 523 R Axis:   -74 Text Interpretation:  Atrial fibrillation with rapid V-rate RVH with  secondary repolarization abnrm Inferolateral infarct, old Baseline wander  in lead(s) V3 Confirmed by Avaree Gilberti  MD, Seerat Peaden [phone]) on 11/02/2014  10:15:17 AM       CRITICAL CARE Performed by: Vanetta Mulders Total critical care time: 30 Critical care time was exclusive of separately billable procedures and treating other patients. Critical care was necessary to treat or prevent imminent or life-threatening deterioration. Critical care was time spent personally by me on the following activities: development of treatment plan with patient and/or surrogate as well as nursing, discussions with consultants, evaluation of patient's response to treatment, examination of patient, obtaining history from patient or surrogate, ordering and performing treatments and interventions, ordering and review of laboratory studies, ordering and review of radiographic studies, pulse oximetry and re-evaluation of patient's condition.     MDM   Final diagnoses:  Altered mental status  HCAP (healthcare-associated pneumonia)  Sepsis, due to unspecified organism    Patient from [facility]. Patient is DO NOT RESUSCITATE. Patient treated for pneumonia with Rocephin for one week. Patient noted to have increased altered mental status and hypoxia this morning. Patient normally on BiPAP at night. When they took patient off BiPAP noted that wasn't very oriented.  Workup  here done as if this could be a septic picture. Patient was tachycardic with atrial fibrillation and has a history of appendectomy is feeling that maybe the tachycardia could be exacerbated by the sepsis. Chest x-ray consistent with persistent pneumonia. Patient treated with vancomycin and Zosyn. Blood cultures done. Patient received fluids. Altered mental status improving with fluids. [place] blood gas without any significant hypoxia on the oxygen or any significant retention of CO2. No significant acidosis. Lactic acid was slightly elevated.  Patient's troponin was also elevated however historically said chronic slight elevations of troponins. Do not feel that this is an acute cardiac event.   Patient discussed with hospitalist team they will admit.    I personally performed the services described in this  documentation, which was scribed in my presence. The recorded information has been reviewed and is accurate.     Vanetta Mulders, MD 11/02/14 1227

## 2014-11-02 NOTE — ED Notes (Signed)
Patient with h/o recent pneumonia, on antibiotics at facility. Staff reports decreased LOC today with low oxygen sats at facility. Hands cyanotic/cold. Oxygen sats 99-100% on 4 L [place] upon arrival. Patient alert, responds to verbal stimuli. Patient Afib in 140s on monitor.

## 2014-11-02 NOTE — ED Notes (Signed)
Patient to CT.

## 2014-11-02 NOTE — H&P (Signed)
[facility] History and Physical  [name] ZOX:[id number] DOB: October 01, 1923    PCP:   Trinna Post, MD   Chief Complaint: altered mental status.  HPI: [name] is an 81 y.o. male with advanced Dementia (thinking his daughter was his wife), CAD, PAF not on anticoagulation, hx of HTN, HLD and CAD, CHF, blindness OS, SNF resident with DNR code status, was Tx for PNA with Rocephin for the past 5 days, brought to the ER as he was less responsive today.  He was placed on Bipap, and in the ER became more alert after IVF given.  Evaluation included a CXR showed worsening aeration on both bases, and increase consolidation on the RLL, with bilateral pleural effusion R >L, with normal WBC and Cr of 1.25, normal LFTs.  He was given a little bit of IVF, and became more alert.  Originally he was scheduled for head CT, but I cancelled it.  His EKG showed afib with RVR at 140's.  His ABG: 7.40/49/POx=165/4L.  Hospitalist was asked to admit him for HCAP.  He was given IV Van/Zosyn.   Rewiew of Systems: Unable.     Past Medical History  Diagnosis Date  . CAD (coronary artery disease)     CABG 03/2001  . Hyperlipidemia   . DJD (degenerative joint disease)     Neck, shoulder and knees  . BPH (benign prostatic hypertrophy)   . Carotid artery disease   . PAF (paroxysmal atrial fibrillation)     Postoperative  . Essential hypertension   . Tobacco abuse, in remission     Discontinued in 1990  . Paroxysmal atrial flutter   . Dementia   . Pneumonia   . Dementia   . Dyspnea   . Constipation   . Blind left eye   . CHF (congestive heart failure)   . Acute ischemic heart disease   . Dysphagia   . Gait difficulty   . Atelectasis     Past Surgical History  Procedure Laterality Date  . Cataract extraction w/ intraocular lens implant Right   . Coronary artery bypass graft  2003  . Shoulder hemi-arthroplasty Right     Dr. Romeo Apple    Medications:  HOME MEDS: Prior to  Admission medications   Medication Sig Start Date End Date Taking? Authorizing Provider  amLODipine (NORVASC) 5 MG tablet Take 5 mg by mouth daily.   Yes Historical Provider, MD  aspirin EC 81 MG tablet Take 81 mg by mouth daily.   Yes Historical Provider, MD  atorvastatin (LIPITOR) 10 MG tablet Take 10 mg by mouth daily.   Yes Historical Provider, MD  diltiazem (CARDIZEM) 120 MG tablet Take 120 mg by mouth daily.   Yes Historical Provider, MD  furosemide (LASIX) 40 MG tablet Take 40 mg by mouth.   Yes Historical Provider, MD  latanoprost (XALATAN) 0.005 % ophthalmic solution Place 1 drop into both eyes daily.   Yes Historical Provider, MD  levofloxacin (LEVAQUIN) 500 MG/100ML SOLN Inject 500 mg into the vein daily. For 7 days(start 11/02/14)   Yes Historical Provider, MD  LORazepam (ATIVAN) 0.5 MG tablet Take 0.5 tablets (0.25 mg total) by mouth at bedtime as needed for anxiety or sleep. Patient taking differently: Take 0.5 mg by mouth at bedtime.  07/23/14  Yes Maryann Mikhail, DO  Melatonin 3 MG CAPS Take 3 mg by mouth at bedtime.   Yes Historical Provider, MD  metoprolol succinate (TOPROL-XL) 50 MG 24 hr tablet Take 50 mg  by mouth daily. Take with or immediately following a meal.   Yes Historical Provider, MD  aspirin 81 MG tablet Take 81 mg by mouth daily.     Historical Provider, MD  bimatoprost (LUMIGAN) 0.01 % SOLN Place 1 drop into the right eye at bedtime.    Historical Provider, MD  brimonidine-timolol (COMBIGAN) 0.2-0.5 % ophthalmic solution Place 1 drop into the right eye daily at 6 PM.     Historical Provider, MD  diltiazem (CARDIZEM CD) 120 MG 24 hr capsule Take 1 capsule (120 mg total) by mouth daily. 07/23/14   Maryann Mikhail, DO  docusate sodium (COLACE) 100 MG capsule Take 100 mg by mouth 2 (two) times daily.    Historical Provider, MD  furosemide (LASIX) 20 MG tablet Take 1.5 tablets (30 mg total) by mouth daily. 08/13/14   Jodelle Gross, NP  levofloxacin (LEVAQUIN) 750 MG  tablet Take 1 tablet (750 mg total) by mouth every other day. Patient not taking: Reported on 08/07/2014 07/23/14   Nita Sells Mikhail, DO  lisinopril-hydrochlorothiazide (PRINZIDE,ZESTORETIC) 20-12.5 MG per tablet Take 1 tablet by mouth daily. 08/06/14   Jodelle Gross, NP  metoprolol (LOPRESSOR) 50 MG tablet Take 1 tablet (50 mg total) by mouth once. 08/06/14   Jodelle Gross, NP  oxyCODONE-acetaminophen (PERCOCET/ROXICET) 5-325 MG per tablet Take 1 tablet by mouth every 6 (six) hours as needed for severe pain.    Historical Provider, MD  potassium chloride SA (K-DUR,KLOR-CON) 20 MEQ tablet Take 1 tablet (20 mEq total) by mouth daily. 08/13/14   Jodelle Gross, NP  simvastatin (ZOCOR) 20 MG tablet Take 1 tablet (20 mg total) by mouth daily. 12/14/11   Jonelle Sidle, MD  traZODone (DESYREL) 50 MG tablet Take 50 mg by mouth at bedtime.    Historical Provider, MD     Allergies:  No Known Allergies  Social History:   reports that he has quit smoking. His smoking use included Cigarettes. He does not have any smokeless tobacco history on file. He reports that he does not drink alcohol or use illicit drugs.  Family History: Family History  Problem Relation Age of Onset  . Sudden death Father 87     Physical Exam: Filed Vitals:   11/02/14 1108 11/02/14 1128 11/02/14 1130 11/02/14 1200  BP: 108/91  127/99 133/89  Pulse:   104 97  Temp:    97.5 F (36.4 C)  TempSrc:    Oral  Resp: 13  19 17   Weight:  58.333 kg (128 lb 9.6 oz)    SpO2:   91% 93%   Blood pressure 133/89, pulse 97, temperature 97.5 F (36.4 C), temperature source Oral, resp. rate 17, weight 58.333 kg (128 lb 9.6 oz), SpO2 93 %.  GEN:  Pleasant  patient lying in the stretcher in no acute distress; cooperative with exam. PSYCH:  Alert, but confused; does not appear anxious or depressed; affect is appropriate. HEENT: Mucous membranes pink and anicteric; PERRLA; EOM intact; no cervical lymphadenopathy nor thyromegaly  or carotid bruit; no JVD; There were no stridor. Neck is very supple. Breasts:: Not examined CHEST WALL: No tenderness CHEST: Normal respiration, clear to auscultation bilaterally.  HEART: irregular and tachycardic.  There are no murmur, rub, or gallops.   BACK: No kyphosis or scoliosis; no CVA tenderness ABDOMEN: soft and non-tender; no masses, no organomegaly, normal abdominal bowel sounds; no pannus; no intertriginous candida. There is no rebound and no distention. Rectal Exam: Not done EXTREMITIES: No bone or  joint deformity; age-appropriate arthropathy of the hands and knees; no edema; no ulcerations.  There is no calf tenderness. Genitalia: not examined PULSES: 2+ and symmetric SKIN: Normal hydration no rash or ulceration CNS: Cranial nerves 2-12 grossly intact no focal lateralizing neurologic deficit.  Speech is fluent; uvula elevated with phonation, facial symmetry and tongue midline. DTR are normal bilaterally, cerebella exam is intact, barbinski is negative and strengths are equaled bilaterally.  No sensory loss.   Labs on Admission:  Basic Metabolic Panel:  Recent Labs Lab 11/02/14 1015  NA 147*  K 3.9  CL 107  CO2 34*  GLUCOSE 104*  BUN 35*  CREATININE 1.25*  CALCIUM 8.8*   Liver Function Tests:  Recent Labs Lab 11/02/14 1031  AST 43*  ALT 23  ALKPHOS 71  BILITOT 0.4  PROT 6.8  ALBUMIN 2.7*    Recent Labs Lab 11/02/14 1031  LIPASE 33   CBC:  Recent Labs Lab 11/02/14 1015  WBC 5.6  NEUTROABS 4.3  HGB 12.2*  HCT 40.7  MCV 100.0  PLT 199   Cardiac Enzymes:  Recent Labs Lab 11/02/14 1015  TROPONINI 0.05*   Radiological Exams on Admission: Dg Chest Portable 1 View  11/02/2014   CLINICAL DATA:  82 year old male with pneumonia and shortness of breath.  EXAM: PORTABLE CHEST - 1 VIEW  COMPARISON:  Chest x-ray 10/24/2014.  FINDINGS: Lung volumes have decreased slightly. Worsening bibasilar opacities may reflect areas of increasing atelectasis  and/or consolidation, particularly in the right lung base. Small left and moderate right-sided pleural effusions. Mild cephalization of the pulmonary vasculature, without frank pulmonary edema. Heart size is borderline enlarged. Upper mediastinal contours are distorted by patient positioning. Atherosclerosis in the thoracic aorta. Status post median sternotomy for CABG. Right shoulder arthroplasty.  IMPRESSION: 1. Worsening aeration in the lung bases bilaterally, with increasing atelectasis and/or consolidation, particularly in the right lower lobe, as well as moderate right and small left pleural effusions. 2. Atherosclerosis.   Electronically Signed   By: Trudie Reed M.D.   On: 11/02/2014 11:09    EKG: Independently reviewed.   Assessment/Plan Present on Admission:  . Altered mental status . Shortness of breath . PAF (paroxysmal atrial fibrillation) . HCAP (healthcare-associated pneumonia) . Afib  PLAN:  Will admit him for likely HCAP, afib with RVR, altered mental status from hypoxia and respiratory failure, and could be septic though less likely.  I spoke with his daughter, who is the POA/HCP, and reviewed the MOST form.  He is a DNR, and no TF, but OK for Tx and IVF.  Will need to start him on IV Cardiazem infusion.  Will hold off on bolus.  He has hx of CHF, so will not give any further IVF.  Continue with IV Van/Zosyn pending cultures and clinical response.  I will admit him to ICU, and discussed with his daughter about goal of care.  She is in perfect agreement.  She felt that if he deteriorates in anyway, focus should be shifted to comfort care.  I have cancelled the head CT thinking that it will unlikely to change therapy.  Thank you for allowing me to participate in his care.   Other plans as per orders.  Code Status: Doran Durand, MD. [facility] Pager [phone] 7pm to 7am.  11/02/2014, 1:18 PM

## 2014-11-02 NOTE — Progress Notes (Signed)
Pharmacy Note:  Initial antibiotics for Vancomycin and Zosyn ordered by EDP for Sepsis.  Estimated Creatinine Clearance: 33 mL/min (by C-G formula based on Cr of 1.25).   No Known Allergies  Filed Vitals:   11/02/14 1130  BP: 127/99  Pulse: 104  Temp:   Resp: 19    Anti-infectives    Start     Dose/Rate Route Frequency Ordered Stop   11/02/14 1115  piperacillin-tazobactam (ZOSYN) IVPB 3.375 g     3.375 g 100 mL/hr over 30 Minutes Intravenous  Once 11/02/14 1102 11/02/14 1149   11/02/14 1115  vancomycin (VANCOCIN) IVPB 1000 mg/200 mL premix     1,000 mg 200 mL/hr over 60 Minutes Intravenous  Once 11/02/14 1102        Plan: Initial doses of Vancomycin 1gm and Zosyn 3.375gm X 1 ordered. F/U admission orders for further dosing if therapy continued.  Mady Gemma, [facility] 11/02/2014 11:55 AM

## 2014-11-02 NOTE — ED Notes (Signed)
Unable to obtain blood for 2nd Istat lactic acid. Unable to obtain second IV.

## 2014-11-03 DIAGNOSIS — I482 Chronic atrial fibrillation: Secondary | ICD-10-CM

## 2014-11-03 LAB — BASIC METABOLIC PANEL
ANION GAP: 9 (ref 5–15)
BUN: 32 mg/dL — AB (ref 6–20)
CO2: 30 mmol/L (ref 22–32)
Calcium: 8.4 mg/dL — ABNORMAL LOW (ref 8.9–10.3)
Chloride: 109 mmol/L (ref 101–111)
Creatinine, Ser: 1.23 mg/dL (ref 0.61–1.24)
GFR calc non Af Amer: 50 mL/min — ABNORMAL LOW (ref >60)
GFR, EST AFRICAN AMERICAN: 58 mL/min — AB (ref >60)
Glucose, Bld: 99 mg/dL (ref 65–99)
Potassium: 3.7 mmol/L (ref 3.5–5.1)
Sodium: 148 mmol/L — ABNORMAL HIGH (ref 135–145)

## 2014-11-03 LAB — CBC
HCT: 36.8 % — ABNORMAL LOW (ref 39.0–52.0)
Hemoglobin: 10.9 g/dL — ABNORMAL LOW (ref 13.0–17.0)
MCH: 29.9 pg (ref 26.0–34.0)
MCHC: 29.6 g/dL — AB (ref 30.0–36.0)
MCV: 100.8 fL — AB (ref 78.0–100.0)
Platelets: 195 10*3/uL (ref 150–400)
RBC: 3.65 MIL/uL — AB (ref 4.22–5.81)
RDW: 15.8 % — AB (ref 11.5–15.5)
WBC: 7.4 10*3/uL (ref 4.0–10.5)

## 2014-11-03 LAB — TROPONIN I: Troponin I: 0.05 ng/mL — ABNORMAL HIGH (ref <0.031)

## 2014-11-03 MED ORDER — ATENOLOL 25 MG PO TABS
50.0000 mg | ORAL_TABLET | Freq: Every day | ORAL | Status: DC
  Administered 2014-11-03: 50 mg via ORAL
  Filled 2014-11-03: qty 2

## 2014-11-03 MED ORDER — MORPHINE SULFATE 2 MG/ML IJ SOLN
2.0000 mg | Freq: Once | INTRAMUSCULAR | Status: AC
  Administered 2014-11-03: 2 mg via INTRAVENOUS
  Filled 2014-11-03: qty 1

## 2014-11-03 MED ORDER — METOPROLOL TARTRATE 1 MG/ML IV SOLN
5.0000 mg | INTRAVENOUS | Status: AC
Start: 2014-11-03 — End: 2014-11-03
  Administered 2014-11-03: 5 mg via INTRAVENOUS
  Filled 2014-11-03: qty 5

## 2014-11-03 MED ORDER — METOPROLOL TARTRATE 1 MG/ML IV SOLN
5.0000 mg | Freq: Once | INTRAVENOUS | Status: AC
  Administered 2014-11-03: 5 mg via INTRAVENOUS
  Filled 2014-11-03: qty 5

## 2014-11-03 NOTE — Progress Notes (Signed)
[facility] PROGRESS NOTE  [name] ZOX:[id number] DOB: 1923/12/18    PCP:   Trinna Post, MD   HPI: [name] is an 81 y.o. male with advanced Dementia (thinking his daughter was his wife), CAD, PAF not on anticoagulation, hx of HTN, HLD and CAD, CHF, blindness OS, SNF resident with DNR code status, was Tx for PNA with Rocephin for the past 5 days, brought to the ER altered and having respiratory problems.  He is much better today.  He gets confused and agitated at times.  Currently on broad spectrum antibiotics.  He has been on Diltiazem and his rate improved a little.   Rewiew of Systems:  Constitutional: Negative for malaise, fever and chills. No significant weight loss or weight gain Eyes: Negative for eye pain, redness and discharge, diplopia, visual changes, or flashes of light. ENMT: Negative for ear pain, hoarseness, nasal congestion, sinus pressure and sore throat. No headaches; tinnitus, drooling, or problem swallowing. Cardiovascular: Negative for chest pain, palpitations, diaphoresis, dyspnea and peripheral edema. ; No orthopnea, PND Respiratory: Negative for cough, hemoptysis, wheezing and stridor. No pleuritic chestpain. Gastrointestinal: Negative for nausea, vomiting, diarrhea, constipation, abdominal pain, melena, blood in stool, hematemesis, jaundice and rectal bleeding.    Genitourinary: Negative for frequency, dysuria, incontinence,flank pain and hematuria; Musculoskeletal: Negative for back pain and neck pain. Negative for swelling and trauma.;  Skin: . Negative for pruritus, rash, abrasions, bruising and skin lesion.; ulcerations Neuro: Negative for headache, lightheadedness and neck stiffness. Negative for weakness, altered level of consciousness , altered mental status, extremity weakness, burning feet, involuntary movement, seizure and syncope.  Psych: negative for anxiety, depression, insomnia, tearfulness, panic attacks, hallucinations,  paranoia, suicidal or homicidal ideation    Past Medical History  Diagnosis Date  . CAD (coronary artery disease)     CABG 03/2001  . Hyperlipidemia   . DJD (degenerative joint disease)     Neck, shoulder and knees  . BPH (benign prostatic hypertrophy)   . Carotid artery disease   . PAF (paroxysmal atrial fibrillation)     Postoperative  . Essential hypertension   . Tobacco abuse, in remission     Discontinued in 1990  . Paroxysmal atrial flutter   . Dementia   . Pneumonia   . Dementia   . Dyspnea   . Constipation   . Blind left eye   . CHF (congestive heart failure)   . Acute ischemic heart disease   . Dysphagia   . Gait difficulty   . Atelectasis     Past Surgical History  Procedure Laterality Date  . Cataract extraction w/ intraocular lens implant Right   . Coronary artery bypass graft  2003  . Shoulder hemi-arthroplasty Right     Dr. Romeo Apple    Medications:  HOME MEDS: Prior to Admission medications   Medication Sig Start Date End Date Taking? Authorizing Provider  acetaminophen (TYLENOL) 650 MG CR tablet Take 650 mg by mouth 2 (two) times daily.   Yes Historical Provider, MD  Amino Acids-Protein Hydrolys (FEEDING SUPPLEMENT, PRO-STAT SUGAR FREE 64,) LIQD Take 30 mLs by mouth 2 (two) times daily.   Yes Historical Provider, MD  amLODipine (NORVASC) 5 MG tablet Take 5 mg by mouth daily.   Yes Historical Provider, MD  aspirin 81 MG tablet Take 81 mg by mouth daily.    Yes Historical Provider, MD  aspirin EC 81 MG tablet Take 81 mg by mouth daily.   Yes Historical Provider, MD  atorvastatin (  LIPITOR) 10 MG tablet Take 10 mg by mouth daily.   Yes Historical Provider, MD  brimonidine-timolol (COMBIGAN) 0.2-0.5 % ophthalmic solution Place 1 drop into the right eye 2 (two) times daily.    Yes Historical Provider, MD  cefTRIAXone (ROCEPHIN) 1 G injection Inject 1 g into the muscle daily. 7 day course starting on 11/02/2014   Yes Historical Provider, MD  cholecalciferol  (VITAMIN D) 1000 UNITS tablet Take 1,000 Units by mouth daily.   Yes Historical Provider, MD  diltiazem (CARDIZEM CD) 120 MG 24 hr capsule Take 1 capsule (120 mg total) by mouth daily. 07/23/14  Yes Maryann Mikhail, DO  docusate sodium (COLACE) 100 MG capsule Take 100 mg by mouth 2 (two) times daily.   Yes Historical Provider, MD  furosemide (LASIX) 40 MG tablet Take 40 mg by mouth daily.    Yes Historical Provider, MD  latanoprost (XALATAN) 0.005 % ophthalmic solution Place 1 drop into both eyes daily.   Yes Historical Provider, MD  levofloxacin (LEVAQUIN) 500 MG/100ML SOLN Inject 500 mg into the vein daily. For 7 days(start 11/02/14)   Yes Historical Provider, MD  LORazepam (ATIVAN) 0.5 MG tablet Take 0.5 tablets (0.25 mg total) by mouth at bedtime as needed for anxiety or sleep. Patient taking differently: Take 0.5 mg by mouth at bedtime.  07/23/14  Yes Maryann Mikhail, DO  Melatonin 3 MG CAPS Take 3 mg by mouth at bedtime.   Yes Historical Provider, MD  metoprolol succinate (TOPROL-XL) 50 MG 24 hr tablet Take 50 mg by mouth daily. Take with or immediately following a meal.   Yes Historical Provider, MD  moxifloxacin (AVELOX) 400 MG tablet Take 400 mg by mouth daily at 8 pm. 7 day course starting 11/02/2014   Yes Historical Provider, MD  Nutritional Supplements (RESOURCE 2.0 PO) Take 120 mLs by mouth 2 (two) times daily.   Yes Historical Provider, MD  OXYGEN Inhale into the lungs daily.   Yes Historical Provider, MD  traZODone (DESYREL) 50 MG tablet Take 50 mg by mouth at bedtime.   Yes Historical Provider, MD     Allergies:  No Known Allergies  Social History:   reports that he has quit smoking. His smoking use included Cigarettes. He does not have any smokeless tobacco history on file. He reports that he does not drink alcohol or use illicit drugs.  Family History: Family History  Problem Relation Age of Onset  . Sudden death Father 78     Physical Exam: Filed Vitals:   11/03/14 0815  11/03/14 0830 11/03/14 0845 11/03/14 0900  BP: 139/69 116/62 112/65 101/50  Pulse: 67 81 91 88  Temp:      TempSrc:      Resp: 18 8 7 9   Height:      Weight:      SpO2: 99% 100% 100% 100%   Blood pressure 101/50, pulse 88, temperature 97.7 F (36.5 C), temperature source Oral, resp. rate 9, height 5\' 9"  (1.753 m), weight 60.8 kg (134 lb 0.6 oz), SpO2 100 %.  GEN:  Pleasantpatient lying in the stretcher in no acute distress; cooperative with exam. PSYCH:  alert but confused.  does not appear anxious or depressed; affect is appropriate. HEENT: Mucous membranes pink and anicteric; PERRLA; EOM intact; no cervical lymphadenopathy nor thyromegaly or carotid bruit; no JVD; There were no stridor. Neck is very supple. Breasts:: Not examined CHEST WALL: No tenderness CHEST: Normal respiration, clear to auscultation bilaterally.  HEART: Irregular rate and rhythm.  There are no murmur, rub, or gallops.   BACK: No kyphosis or scoliosis; no CVA tenderness ABDOMEN: soft and non-tender; no masses, no organomegaly, normal abdominal bowel sounds; no pannus; no intertriginous candida. There is no rebound and no distention. Rectal Exam: Not done EXTREMITIES: No bone or joint deformity; age-appropriate arthropathy of the hands and knees; no edema; no ulcerations.  There is no calf tenderness. Genitalia: not examined PULSES: 2+ and symmetric SKIN: Normal hydration no rash or ulceration CNS: Cranial nerves 2-12 grossly intact no focal lateralizing neurologic deficit.  Speech is fluent; uvula elevated with phonation, facial symmetry and tongue midline. DTR are normal bilaterally, cerebella exam is intact, barbinski is negative and strengths are equaled bilaterally.  No sensory loss.   Labs on Admission:  Basic Metabolic Panel:  Recent Labs Lab 11/02/14 1015 11/03/14 0220  NA 147* 148*  K 3.9 3.7  CL 107 109  CO2 34* 30  GLUCOSE 104* 99  BUN 35* 32*  CREATININE 1.25* 1.23  CALCIUM 8.8* 8.4*    Liver Function Tests:  Recent Labs Lab 11/02/14 1031  AST 43*  ALT 23  ALKPHOS 71  BILITOT 0.4  PROT 6.8  ALBUMIN 2.7*    Recent Labs Lab 11/02/14 1031  LIPASE 33   Recent Labs Lab 11/02/14 1015 11/03/14 0220  WBC 5.6 7.4  NEUTROABS 4.3  --   HGB 12.2* 10.9*  HCT 40.7 36.8*  MCV 100.0 100.8*  PLT 199 195   Cardiac Enzymes:  Recent Labs Lab 11/02/14 1015 11/02/14 1449 11/02/14 2008 11/03/14 0220  TROPONINI 0.05* 0.05* 0.05* 0.05*   Radiological Exams on Admission: Dg Chest Portable 1 View  11/02/2014   CLINICAL DATA:  81 year old male with pneumonia and shortness of breath.  EXAM: PORTABLE CHEST - 1 VIEW  COMPARISON:  Chest x-ray 10/24/2014.  FINDINGS: Lung volumes have decreased slightly. Worsening bibasilar opacities may reflect areas of increasing atelectasis and/or consolidation, particularly in the right lung base. Small left and moderate right-sided pleural effusions. Mild cephalization of the pulmonary vasculature, without frank pulmonary edema. Heart size is borderline enlarged. Upper mediastinal contours are distorted by patient positioning. Atherosclerosis in the thoracic aorta. Status post median sternotomy for CABG. Right shoulder arthroplasty.  IMPRESSION: 1. Worsening aeration in the lung bases bilaterally, with increasing atelectasis and/or consolidation, particularly in the right lower lobe, as well as moderate right and small left pleural effusions. 2. Atherosclerosis.   Electronically Signed   By: Trudie Reed M.D.   On: 11/02/2014 11:09    EKG: Independently reviewed.    Assessment/Plan  Present on Admission:  . Altered mental status:  Doing better.  He has advanced dementia, and likely aggravated with HCAP and medications.  Continue with IV Van/Zosyn, and hold sedatives.  . Shortness of breath:  Partly due to HCAP, and with RVR.  Will give Atenolol and d/c diltiazem today.  Keep in ICU today.  Marland Kitchen PAF (paroxysmal atrial fibrillation): Not a  anticoagulation candidate.  Will keep rate controlled.  Marland Kitchen HCAP (healthcare-associated pneumonia): Continue with IV Van/Zosyn.  Doing better.     Other plans as per orders.  Code Status: DNR.    Houston Siren, MD. [facility] Pager [phone] 7pm to 7am.  11/03/2014, 9:38 AM

## 2014-11-04 LAB — BASIC METABOLIC PANEL
Anion gap: 6 (ref 5–15)
BUN: 32 mg/dL — AB (ref 6–20)
CALCIUM: 8.5 mg/dL — AB (ref 8.9–10.3)
CHLORIDE: 109 mmol/L (ref 101–111)
CO2: 31 mmol/L (ref 22–32)
CREATININE: 1.4 mg/dL — AB (ref 0.61–1.24)
GFR calc non Af Amer: 43 mL/min — ABNORMAL LOW (ref >60)
GFR, EST AFRICAN AMERICAN: 50 mL/min — AB (ref >60)
Glucose, Bld: 86 mg/dL (ref 65–99)
Potassium: 4 mmol/L (ref 3.5–5.1)
SODIUM: 146 mmol/L — AB (ref 135–145)

## 2014-11-04 LAB — URINE CULTURE: Culture: NO GROWTH

## 2014-11-04 MED ORDER — ATENOLOL 25 MG PO TABS
75.0000 mg | ORAL_TABLET | Freq: Every day | ORAL | Status: DC
  Administered 2014-11-04 – 2014-11-05 (×2): 75 mg via ORAL
  Filled 2014-11-04 (×2): qty 3

## 2014-11-04 NOTE — Progress Notes (Signed)
Initial Nutrition Assessment  DOCUMENTATION CODES:   Severe malnutrition in context of chronic illness   Pt meets criteria for severe MALNUTRITION in the context of chronic illness as evidenced by severe depletions of muscle and energy intake and  </= 75% energy intake for >/= 1 month.  INTERVENTION:  Boost Breeze po TID, each supplement provides 250 kcal and 9 grams of protein   Assist with feeding  Recommend pt continue oral supplements TID with discharge back orders to [facility]   NUTRITION DIAGNOSIS:   Inadequate oral intake related to dysphagia, chronic illness as evidenced by per patient/family report, meal completion < 25%.   GOAL: Pt will meet nutrition needs as able     MONITOR:   PO intake, Supplement acceptance  REASON FOR ASSESSMENT:   Malnutrition Screening Tool    ASSESSMENT:  Pt is from [facility] and plans to return. He has advanced dementia, HTN, CHF and was recently treated for PNA.  Pt had MBSS on 8/1 as outpatient secondary to dypshagia. His po intake has been very poor. He was able to feed himself until recently but in now dependent for feeding. His daughter is actively involved in his care. Daughter reports significant wt loss but is unable to quantify. With the progression of his dementia recommend increase daily oral nutrition supplements to help with meeting nutrition needs.    Diet Order:  DIET DYS 2 Room service appropriate?: Yes; Fluid consistency:: Thin  Skin:   dry   Last BM:   unknown  Height:   Ht Readings from Last 1 Encounters:  11/02/14  (1.753 m)    Weight:   Wt Readings from Last 1 Encounters:  11/03/14 134 lb 0.6 oz (60.8 kg)    Ideal Body Weight:   72.7 kg  BMI:  Body mass index is 19.79 kg/(m^2).  Estimated Nutritional Needs:   Kcal:  0981-1914  Protein:  79-85 gr  Fluid:  1.8-2.1 liters daily  EDUCATION NEEDS:   Education needs no appropriate at this time  Royann Shivers MS,RD,CSG,LDN Office: [phone] Pager:  [phone]

## 2014-11-04 NOTE — Plan of Care (Signed)
Problem: Phase I Progression Outcomes Goal: Anticoagulation Therapy per MD order Outcome: Not Applicable Date Met:  86/82/57 Pt not a candidate for anticoagulation per MD note

## 2014-11-04 NOTE — Progress Notes (Signed)
[facility] PROGRESS NOTE  [name]:[id number] DOB: 1924-01-20    PCP:   Trinna Post, MD   HPI:   [name] is an 81 y.o. male with advanced Dementia (thinking his daughter was his wife), CAD, PAF not on anticoagulation, hx of HTN, HLD and CAD, CHF, blindness OS, SNF resident with DNR code status, was Tx for PNA with Rocephin for the past 5 days, brought to the ER altered and having respiratory problems. He is much better today. He gets confused and agitated at times. Currently on broad spectrum antibiotics. He has been on Diltiazem and his rate improved a little.  Yesterday, he had one episode of CP, with negative EKG, relieved with morphine.  He was agitated and was given some sedative.   Rewiew of Systems: Unable.  He is sleeping.   Past Medical History  Diagnosis Date  . CAD (coronary artery disease)     CABG 03/2001  . Hyperlipidemia   . DJD (degenerative joint disease)     Neck, shoulder and knees  . BPH (benign prostatic hypertrophy)   . Carotid artery disease   . PAF (paroxysmal atrial fibrillation)     Postoperative  . Essential hypertension   . Tobacco abuse, in remission     Discontinued in 1990  . Paroxysmal atrial flutter   . Dementia   . Pneumonia   . Dementia   . Dyspnea   . Constipation   . Blind left eye   . CHF (congestive heart failure)   . Acute ischemic heart disease   . Dysphagia   . Gait difficulty   . Atelectasis     Past Surgical History  Procedure Laterality Date  . Cataract extraction w/ intraocular lens implant Right   . Coronary artery bypass graft  2003  . Shoulder hemi-arthroplasty Right     Dr. Romeo Apple    Medications:  HOME MEDS: Prior to Admission medications   Medication Sig Start Date End Date Taking? Authorizing Provider  acetaminophen (TYLENOL) 650 MG CR tablet Take 650 mg by mouth 2 (two) times daily.   Yes Historical Provider, MD  Amino Acids-Protein Hydrolys (FEEDING SUPPLEMENT, PRO-STAT  SUGAR FREE 64,) LIQD Take 30 mLs by mouth 2 (two) times daily.   Yes Historical Provider, MD  amLODipine (NORVASC) 5 MG tablet Take 5 mg by mouth daily.   Yes Historical Provider, MD  aspirin 81 MG tablet Take 81 mg by mouth daily.    Yes Historical Provider, MD  aspirin EC 81 MG tablet Take 81 mg by mouth daily.   Yes Historical Provider, MD  atorvastatin (LIPITOR) 10 MG tablet Take 10 mg by mouth daily.   Yes Historical Provider, MD  brimonidine-timolol (COMBIGAN) 0.2-0.5 % ophthalmic solution Place 1 drop into the right eye 2 (two) times daily.    Yes Historical Provider, MD  cefTRIAXone (ROCEPHIN) 1 G injection Inject 1 g into the muscle daily. 7 day course starting on 11/02/2014   Yes Historical Provider, MD  cholecalciferol (VITAMIN D) 1000 UNITS tablet Take 1,000 Units by mouth daily.   Yes Historical Provider, MD  diltiazem (CARDIZEM CD) 120 MG 24 hr capsule Take 1 capsule (120 mg total) by mouth daily. 07/23/14  Yes Maryann Mikhail, DO  docusate sodium (COLACE) 100 MG capsule Take 100 mg by mouth 2 (two) times daily.   Yes Historical Provider, MD  furosemide (LASIX) 40 MG tablet Take 40 mg by mouth daily.    Yes Historical Provider, MD  latanoprost (XALATAN) 0.005 % ophthalmic solution Place 1 drop into both eyes daily.   Yes Historical Provider, MD  levofloxacin (LEVAQUIN) 500 MG/100ML SOLN Inject 500 mg into the vein daily. For 7 days(start 11/02/14)   Yes Historical Provider, MD  LORazepam (ATIVAN) 0.5 MG tablet Take 0.5 tablets (0.25 mg total) by mouth at bedtime as needed for anxiety or sleep. Patient taking differently: Take 0.5 mg by mouth at bedtime.  07/23/14  Yes Maryann Mikhail, DO  Melatonin 3 MG CAPS Take 3 mg by mouth at bedtime.   Yes Historical Provider, MD  metoprolol succinate (TOPROL-XL) 50 MG 24 hr tablet Take 50 mg by mouth daily. Take with or immediately following a meal.   Yes Historical Provider, MD  moxifloxacin (AVELOX) 400 MG tablet Take 400 mg by mouth daily at 8 pm. 7  day course starting 11/02/2014   Yes Historical Provider, MD  Nutritional Supplements (RESOURCE 2.0 PO) Take 120 mLs by mouth 2 (two) times daily.   Yes Historical Provider, MD  OXYGEN Inhale into the lungs daily.   Yes Historical Provider, MD  traZODone (DESYREL) 50 MG tablet Take 50 mg by mouth at bedtime.   Yes Historical Provider, MD     Allergies:  No Known Allergies  Social History:   reports that he has quit smoking. His smoking use included Cigarettes. He does not have any smokeless tobacco history on file. He reports that he does not drink alcohol or use illicit drugs.  Family History: Family History  Problem Relation Age of Onset  . Sudden death Father 71     Physical Exam: Filed Vitals:   11/04/14 0400 11/04/14 0700 11/04/14 0738 11/04/14 0800  BP: 112/55 87/46  123/60  Pulse: 50     Temp: 98.3 F (36.8 C)  97.6 F (36.4 C)   TempSrc: Oral  Axillary   Resp: Height:      Weight:      SpO2: 71%      Blood pressure 123/60, pulse 50, temperature 97.6 F (36.4 C), temperature source Axillary, resp. rate 13, height  (1.753 m), weight 60.8 kg (134 lb 0.6 oz), SpO2 71 %.  GEN:  Pleasant  patient lying in the stretcher in no acute distress; cooperative with exam. PSYCH:  alert and oriented x4; does not appear anxious or depressed; affect is appropriate. HEENT: Mucous membranes pink and anicteric; PERRLA; EOM intact; no cervical lymphadenopathy nor thyromegaly or carotid bruit; no JVD; There were no stridor. Neck is very supple. Breasts:: Not examined CHEST WALL: No tenderness CHEST: Normal respiration, clear to auscultation bilaterally.  HEART: Regular rate and rhythm.  There are no murmur, rub, or gallops.   BACK: No kyphosis or scoliosis; no CVA tenderness ABDOMEN: soft and non-tender; no masses, no organomegaly, normal abdominal bowel sounds; no pannus; no intertriginous candida. There is no rebound and no distention. Rectal Exam: Not  done EXTREMITIES: No bone or joint deformity; age-appropriate arthropathy of the hands and knees; no edema; no ulcerations.  There is no calf tenderness. Genitalia: not examined PULSES: 2+ and symmetric SKIN: Normal hydration no rash or ulceration CNS: Cranial nerves 2-12 grossly intact no focal lateralizing neurologic deficit.  Speech is fluent; uvula elevated with phonation, facial symmetry and tongue midline. DTR are normal bilaterally, cerebella exam is intact, barbinski is negative and strengths are equaled bilaterally.  No sensory loss.   Labs on Admission:  Basic Metabolic Panel:  Recent Labs Lab 11/02/14 1015 11/03/14 0220  11/04/14 0510  NA 147* 148* 146*  K 3.9 3.7 4.0  CL 107 109 109  CO2 34* 30 31  GLUCOSE 104* 99 86  BUN 35* 32* 32*  CREATININE 1.25* 1.23 1.40*  CALCIUM 8.8* 8.4* 8.5*   Liver Function Tests:  Recent Labs Lab 11/02/14 1031  AST 43*  ALT 23  ALKPHOS 71  BILITOT 0.4  PROT 6.8  ALBUMIN 2.7*    Recent Labs Lab 11/02/14 1031  LIPASE 33   No results for input(s): AMMONIA in the last 168 hours. CBC:  Recent Labs Lab 11/02/14 1015 11/03/14 0220  WBC 5.6 7.4  NEUTROABS 4.3  --   HGB 12.2* 10.9*  HCT 40.7 36.8*  MCV 100.0 100.8*  PLT 199 195   Cardiac Enzymes:  Recent Labs Lab 11/02/14 1015 11/02/14 1449 11/02/14 2008 11/03/14 0220  TROPONINI 0.05* 0.05* 0.05* 0.05*    Radiological Exams on Admission: Dg Chest Portable 1 View  11/02/2014   CLINICAL DATA:  81 year old male with pneumonia and shortness of breath.  EXAM: PORTABLE CHEST - 1 VIEW  COMPARISON:  Chest x-ray 10/24/2014.  FINDINGS: Lung volumes have decreased slightly. Worsening bibasilar opacities may reflect areas of increasing atelectasis and/or consolidation, particularly in the right lung base. Small left and moderate right-sided pleural effusions. Mild cephalization of the pulmonary vasculature, without frank pulmonary edema. Heart size is borderline enlarged. Upper  mediastinal contours are distorted by patient positioning. Atherosclerosis in the thoracic aorta. Status post median sternotomy for CABG. Right shoulder arthroplasty.  IMPRESSION: 1. Worsening aeration in the lung bases bilaterally, with increasing atelectasis and/or consolidation, particularly in the right lower lobe, as well as moderate right and small left pleural effusions. 2. Atherosclerosis.   Electronically Signed   By: Trudie Reed M.D.   On: 11/02/2014 11:09   Assessment/Plan Present on Admission:  . Altered mental status . Shortness of breath . PAF (paroxysmal atrial fibrillation) . HCAP (healthcare-associated pneumonia) . Afib  PLAN:  . Altered mental status: Doing better. He has advanced dementia, and likely aggravated with HCAP and medications. Will change antibiotic to Augmentin today.  Transfer him to the telemeter floor.  . Shortness of breath: Partly due to HCAP, and with RVR. Will increase Atenolol to 75mg  per day. Transfer to tele today. Marland Kitchen PAF (paroxysmal atrial fibrillation): Not a anticoagulation candidate. Will keep rate controlled.  Marland Kitchen HCAP (healthcare-associated pneumonia): change to oral antibiotic anticipating discharge to [facility] tomorrow.    Other plans as per orders.  Code Status: DNR.    Houston Siren, MD. [facility] Pager [phone] 7pm to 7am.  11/04/2014, 9:36 AM

## 2014-11-04 NOTE — Progress Notes (Signed)
Pulse ox is incorrect

## 2014-11-04 NOTE — Plan of Care (Signed)
Problem: Phase I Progression Outcomes Goal: Dyspnea controlled at rest Outcome: Completed/Met Date Met:  11/04/14 No dyspnea  Goal: Pain controlled with appropriate interventions Outcome: Completed/Met Date Met:  11/04/14 Pt not c/o pain Goal: Voiding-avoid urinary catheter unless indicated Outcome: Completed/Met Date Met:  11/04/14 Foley dc'd

## 2014-11-05 DIAGNOSIS — E43 Unspecified severe protein-calorie malnutrition: Secondary | ICD-10-CM

## 2014-11-05 LAB — BASIC METABOLIC PANEL
Anion gap: 5 (ref 5–15)
BUN: 28 mg/dL — ABNORMAL HIGH (ref 6–20)
CO2: 33 mmol/L — ABNORMAL HIGH (ref 22–32)
CREATININE: 1.3 mg/dL — AB (ref 0.61–1.24)
Calcium: 8.5 mg/dL — ABNORMAL LOW (ref 8.9–10.3)
Chloride: 108 mmol/L (ref 101–111)
GFR calc non Af Amer: 47 mL/min — ABNORMAL LOW (ref >60)
GFR, EST AFRICAN AMERICAN: 54 mL/min — AB (ref >60)
Glucose, Bld: 100 mg/dL — ABNORMAL HIGH (ref 65–99)
Potassium: 3.7 mmol/L (ref 3.5–5.1)
Sodium: 146 mmol/L — ABNORMAL HIGH (ref 135–145)

## 2014-11-05 MED ORDER — TIMOLOL MALEATE 0.5 % OP SOLN: 1.0000 [drp] | Freq: Every day | OPHTHALMIC | Status: AC

## 2014-11-05 MED ORDER — ATENOLOL 25 MG PO TABS
75.0000 mg | ORAL_TABLET | Freq: Every day | ORAL | Status: AC
Start: 2014-11-05 — End: ?

## 2014-11-05 MED ORDER — ATENOLOL 25 MG PO TABS
25.0000 mg | ORAL_TABLET | Freq: Once | ORAL | Status: AC
  Administered 2014-11-05: 25 mg via ORAL
  Filled 2014-11-05: qty 1

## 2014-11-05 MED ORDER — BRIMONIDINE TARTRATE 0.2 % OP SOLN: 1.0000 [drp] | Freq: Every day | OPHTHALMIC | Status: AC

## 2014-11-05 NOTE — Clinical Documentation Improvement (Signed)
  After study, please clarify respiratory status in progress notes and discharge summary  Possible Clinical Conditions?  Acute Respiratory Failure Acute on Chronic Respiratory Failure Chronic Respiratory Failure Other Condition Cannot Clinically Determine   Supporting Information:  Per ED Provider note: Chief Complaint  Patient presents with  . Altered Mental Status  . Respiratory Distress      Per EMS, RA stat was 70% on 4L. with a fast HR of 160. His is currently on 4L of O2 with RA stat 100%. Chest x-ray consistent with persistent pneumonia  Per H&P: Assessment/Plan Present on Admission:  . Altered mental status . Shortness of breath . PAF (paroxysmal atrial fibrillation) . HCAP (healthcare-associated pneumonia) . Afib  PLAN: Will admit him for likely HCAP, afib with RVR, altered mental status from hypoxia and respiratory failure  ABG:  Component     Latest Ref Rng 11/02/2014  O2 Content      4.0  Delivery systems      NASAL CANNULA  pH, Arterial     7.350 - 7.450 7.404  pCO2 arterial     35.0 - 45.0 mmHg 49.9 (H)  pO2, Arterial     80.0 - 100.0 mmHg 165 (H)  Bicarbonate     20.0 - 24.0 mEq/L 30.5 (H)  TCO2     0 - 100 mmol/L 28.4  Acid-Base Excess     0.0 - 2.0 mmol/L 5.9 (H)  O2 Saturation      99.0  Collection site      RIGHT BRACHIAL  Drawn by      [id number]  Sample type      ARTERIAL DRAW  Allens test (pass/fail)     PASS PASS   Treatment: Pulse Ox continuous monitoring ABG  Thank You, Khloe Hunkele T. Luiz Ochoa, MSN, MBA/MHA Clinical Documentation Specialist [email or web address] Office # [phone]

## 2014-11-05 NOTE — Discharge Summary (Addendum)
Physician Discharge Summary  [name] ZOX:[id number] DOB: Apr 08, 1923 DOA: 11/02/2014  PCP: Trinna Post, MD  Admit date: 11/02/2014 Discharge date: 11/05/2014  Time spent: 35 minutes  Recommendations for Outpatient Follow-up:  1. Follow up with PCP in one week.    Discharge Diagnoses:  Principal Problem:   Altered mental status Active Problems:   PAF (paroxysmal atrial fibrillation)   Shortness of breath   HCAP (healthcare-associated pneumonia)   Afib   Protein-calorie malnutrition, severe   Discharge Condition: much improved.   Diet recommendation:  Dysphagia 2 diet, crush pills.   Filed Weights   11/02/14 1128 11/02/14 1403 11/03/14 0500  Weight: 58.333 kg (128 lb 9.6 oz) 62.7 kg (138 lb 3.7 oz) 60.8 kg (134 lb 0.6 oz)    History of present illness:  Patient was admitted for altered mental status by me on Aug 6th, 2016.  As per my previous H and P:  " [name] is an 81 y.o. male with advanced Dementia (thinking his daughter was his wife), CAD, PAF not on anticoagulation, hx of HTN, HLD and CAD, CHF, blindness OS, SNF resident with DNR code status, was Tx for PNA with Rocephin for the past 5 days, brought to the ER as he was less responsive today. He was placed on Bipap, and in the ER became more alert after IVF given. Evaluation included a CXR showed worsening aeration on both bases, and increase consolidation on the RLL, with bilateral pleural effusion R >L, with normal WBC and Cr of 1.25, normal LFTs. He was given a little bit of IVF, and became more alert. Originally he was scheduled for head CT, but I cancelled it. His EKG showed afib with RVR at 140's. His ABG: 7.40/49/POx=165/4L. Hospitalist was asked to admit him for HCAP. He was given IV Van/Zosyn.    Hospital Course: Patient was admitted for fever and possible sepsis.  It was thought that he may have persistent PNA as well.  He was started on IV Crenshaw and Zosyn, and [facility] were done and remained  negative.  He remained with his known DNR code status, and was placed in the ICU.  He was given IVF.  For his afib with RVR, he was not a candidate for full anticoagulation, so his ASA was continued, and IV Diltiazem was given to him.  His rate was controlled, more so with IV lopressor than the cardiazem drip.  He was subsequently converted to Atenolol, at 50mg  then increased to 75mg  per day.  He was given dysphagia 2 diet, and did well.  He was agitated once, requiring the use of ativan, but no need for longer use, and he became more alert and eating.  He is now stable, and will be discharged back on his oral Avelox.  He will continue this antibiotics for another 5 days.  His youngest daughter is the POA and she agreed with the treatment.   Thank you for allowing me to participate in his care.    Consultations:  None.   Discharge Exam: Filed Vitals:   11/05/14 0622  BP: 115/60  Pulse: 129  Resp: 16   Discharge Instructions   Discharge Instructions    Diet - low sodium heart healthy    Complete by:  As directed      Increase activity slowly    Complete by:  As directed           Current Discharge Medication List    START taking these medications  Details  atenolol (TENORMIN) 25 MG tablet Take 3 tablets (75 mg total) by mouth daily. Qty: 30 tablet, Refills: 1    brimonidine (ALPHAGAN) 0.2 % ophthalmic solution Place 1 drop into the right eye daily at 6 PM. Qty: 5 mL, Refills: 12    timolol (TIMOPTIC) 0.5 % ophthalmic solution Place 1 drop into the right eye daily at 6 PM. Qty: 10 mL, Refills: 12      CONTINUE these medications which have NOT CHANGED   Details  acetaminophen (TYLENOL) 650 MG CR tablet Take 650 mg by mouth 2 (two) times daily.    Amino Acids-Protein Hydrolys (FEEDING SUPPLEMENT, PRO-STAT SUGAR FREE 64,) LIQD Take 30 mLs by mouth 2 (two) times daily.    amLODipine (NORVASC) 5 MG tablet Take 5 mg by mouth daily.    aspirin 81 MG tablet Take 81 mg by mouth  daily.     atorvastatin (LIPITOR) 10 MG tablet Take 10 mg by mouth daily.    brimonidine-timolol (COMBIGAN) 0.2-0.5 % ophthalmic solution Place 1 drop into the right eye 2 (two) times daily.          docusate sodium (COLACE) 100 MG capsule Take 100 mg by mouth 2 (two) times daily.    furosemide (LASIX) 40 MG tablet Take 40 mg by mouth daily.     latanoprost (XALATAN) 0.005 % ophthalmic solution Place 1 drop into both eyes daily.    LORazepam (ATIVAN) 0.5 MG tablet Take 0.5 tablets (0.25 mg total) by mouth at bedtime as needed for anxiety or sleep. Qty: 30 tablet, Refills: 0    Melatonin 3 MG CAPS Take 3 mg by mouth at bedtime.         moxifloxacin (AVELOX) 400 MG tablet Take 400 mg by mouth daily at 8 pm. 7 day course starting 11/02/2014    Nutritional Supplements (RESOURCE 2.0 PO) Take 120 mLs by mouth 2 (two) times daily.    OXYGEN Inhale into the lungs daily.    traZODone (DESYREL) 50 MG tablet Take 50 mg by mouth at bedtime.      STOP taking these medications           cefTRIAXone (ROCEPHIN) 1 G injection      cholecalciferol (VITAMIN D) 1000 UNITS tablet      levofloxacin (LEVAQUIN) 500 MG/100ML SOLN        No Known Allergies    The results of significant diagnostics from this hospitalization (including imaging, microbiology, ancillary and laboratory) are listed below for reference.    Significant Diagnostic Studies: Dg Chest 2 View  10/24/2014   CLINICAL DATA:  Cough and chest congestion  EXAM: CHEST  2 VIEW  COMPARISON:  09/09/2014  FINDINGS: Mild cardiac enlargement stable. Left lung clear except for minimal atelectasis at the base. On the right there remains a moderate effusion with underlying consolidation. Right shoulder replacement and significant left shoulder arthropathy again identified.  IMPRESSION: Persistent opacity right base again suggesting persistent significant pleural effusion with underlying consolidation. Underlying mass not excluded.  Contrast-enhanced CT scan suggested.   Electronically Signed   By: Esperanza Heir M.D.   On: 10/24/2014 11:33   Ct Chest W Contrast  10/24/2014   CLINICAL DATA:  Shortness of breath. Moderate size right pleural effusion with underlying consolidation on chest radiographs earlier today. A chest CT with contrast was recommended for further evaluation. Ex-smoker.  EXAM: CT CHEST WITH CONTRAST  TECHNIQUE: Multidetector CT imaging of the chest was performed during intravenous contrast administration.  CONTRAST:  75mL OMNIPAQUE IOHEXOL 300 MG/ML  SOLN  COMPARISON:  Chest radiographs obtained earlier today. Chest CT dated 09/09/2014.  FINDINGS: Moderate to large-sized right pleural effusion with an interval increase in size since 09/09/2014. There is also a moderate-sized left pleural effusion which has increased in size. There is associated bilateral lower lobe compressive atelectasis. The interstitial markings in the inferior aspects of the upper lobes are mildly prominent bilaterally. No lung nodules or enlarged lymph nodes are seen. Atheromatous coronary artery calcifications are noted. Also noted are post CABG changes and a right shoulder prosthesis with associated streak artifacts. Thoracic spine degenerative changes. Bilateral renal cysts.  IMPRESSION: 1. Moderate to large-sized right pleural effusion and moderate-sized left pleural effusion, both increased. 2. Bilateral compressive lower lobe atelectasis. 3. Mild probable interstitial pulmonary edema and in both upper lobes. Interstitial pneumonitis is less likely. 4. Atheromatous arterial calcifications, including the coronary arteries.   Electronically Signed   By: Beckie Salts M.D.   On: 10/24/2014 17:03   Dg Op Swallowing Func-medicare/speech Path  10/29/2014   Modified Barium Swallow  Patient Details  Name: KYEN TAITE MRN: [id number] Date of Birth: 1923/09/09 Referring Provider: Pearson Grippe, MD  Encounter Date: 10/28/2014      End of Session -  10/28/14 1439    Visit Number 1   Number of Visits 1   Authorization Type [employer]   SLP Start Time 1315   SLP Stop Time  1345   SLP Time Calculation (min) 30 min   Activity Tolerance Patient tolerated treatment well      Past Medical History  Diagnosis Date  . CAD (coronary artery disease)     CABG 03/2001  . Hyperlipidemia   . DJD (degenerative joint disease)     Neck, shoulder and knees  . BPH (benign prostatic hypertrophy)   . Carotid artery disease   . PAF (paroxysmal atrial fibrillation)     Postoperative  . Essential hypertension   . Tobacco abuse, in remission     Discontinued in 1990  . Paroxysmal atrial flutter   . Dementia     Past Surgical History  Procedure Laterality Date  . Cataract extraction w/ intraocular lens implant Right   . Coronary artery bypass graft  2003  . Shoulder hemi-arthroplasty Right     Dr. Romeo Apple    There were no vitals filed for this visit.  Visit Diagnosis: Dysphagia, pharyngoesophageal phase      Subjective Assessment - 10/28/14 1431    Subjective Pt with wet, congested cough and speech   Special Tests MBSS   Currently in Pain? No/denies             General - 10/28/14 1432    General Information   Date of Onset 10/24/14   Other Pertinent Information 81 yo male referred by Dr. Pearson Grippe for  MBSS; resident at [facility]; h/o dementia   Type of Study Other (Comment)  MBSS   Reason for Referral Objectively evaluate swallowing function   Previous Swallow Assessment None on record   Diet Prior to this Study Information not available   Temperature Spikes Noted No   Respiratory Status Room air   History of Recent Intubation No   Behavior/Cognition Alert;Cooperative;Confused;Requires cueing;Pleasant  mood   Oral Cavity - Dentition Poor condition   Oral Motor / Sensory Function Within functional limits   Self-Feeding Abilities Able to  feed self   Patient Positioning Upright in chair/Tumbleform   Baseline Vocal Quality Wet   Volitional  Cough Weak;Congested   Volitional Swallow Able to elicit   Anatomy Other (Comment)  cervical osteophytes noted C4-5   Pharyngeal Secretions Not observed secondary MBS            Oral Preparation/Oral Phase - 10/28/14 1434    Oral Preparation/Oral Phase   Oral Phase Impaired   Oral - Honey   Oral - Honey Cup Weak ligual manipulation;Lingual pumping;Oral residue    Oral - Nectar   Oral - Nectar Cup Weak ligual manipulation;Oral residue   Oral - Thin   Oral - Thin Cup Weak ligual manipulation;Oral residue   Oral - Solids   Oral - Puree Piecemeal swallowing;Weak ligual manipulation   Oral - Mechanical Soft Weak ligual manipulation;Piecemeal swallowing   Oral - Pill Not tested   Electrical stimulation - Oral Phase   Was Electrical Stimulation Used No          Pharyngeal Phase - 10/28/14 1435    Pharyngeal Phase   Pharyngeal Phase Impaired   Pharyngeal - Honey   Pharyngeal- Honey Cup Swallow initiation at pyriform sinus;Delayed  swallow initiation;Reduced pharyngeal peristalsis;Reduced epiglottic  inversion;Reduced tongue base retraction;Pharyngeal residue -  valleculae;Pharyngeal residue - pyriform;Lateral channel residue;Reduced  anterior laryngeal mobility   Pharyngeal - Nectar   Pharyngeal- Nectar Cup Swallow initiation at pyriform sinus;Delayed  swallow initiation;Reduced pharyngeal peristalsis;Reduced epiglottic  inversion;Reduced airway/laryngeal closure;Reduced tongue base  retraction;Penetration/Aspiration during swallow;Penetration/Apiration  after swallow;Trace aspiration;Pharyngeal residue - valleculae;Pharyngeal  residue - pyriform;Pharyngeal residue - cp segment;Lateral channel  residue;Reduced anterior laryngeal mobility   Pharyngeal - Thin   Pharyngeal- Thin Cup Swallow initiation at pyriform sinus;Delayed  swallow  initiation;Reduced pharyngeal peristalsis;Reduced epiglottic  inversion;Reduced airway/laryngeal closure;Reduced tongue base  retraction;Penetration/Aspiration during swallow;Penetration/Apiration  after swallow;Trace aspiration;Moderate aspiration;Pharyngeal residue -  valleculae;Pharyngeal residue - pyriform;Pharyngeal residue - cp  segment;Lateral channel residue;Reduced anterior laryngeal mobility   Pharyngeal - Solids   Pharyngeal- Puree Swallow initiation at vallecula;Delayed swallow  initiation;Reduced epiglottic inversion;Reduced pharyngeal  peristalsis;Reduced anterior laryngeal mobility;Reduced tongue base  retraction;Pharyngeal residue - valleculae;Pharyngeal residue -  pyriform;Lateral channel residue   Pharyngeal- Mechanical Soft Swallow initiation at vallecula;Delayed  swallow initiation;Reduced pharyngeal peristalsis;Reduced epiglottic  inversion;Reduced anterior laryngeal mobility;Reduced tongue base  retraction;Pharyngeal residue - valleculae;Pharyngeal residue -  pyriform;Lateral channel residue   Electrical Stimulation - Pharyngeal Phase   Was Electrical Stimulation Used No          Cricopharyngeal Phase - 10/28/14 1438    Cervical Esophageal Phase   Cervical Esophageal Phase Within functional limits                  Plan - 10/28/14 1439    Clinical Impression Statement Pt presents with functionally moderate to  severe oropharyngeal phase dysphagia characterized by weak lingual  manipulation, impaired mastication, inefficient AP transit and bolus  propulsion with premature spillage over base of tongue, piecemeal  deglutition, delay in swallow initiation, decreased tonge base retraction  resulting in decreased pharyngeal pressure which is also likely negatively  impacted by osteophytes at C4-5 which impinge into pharyngeal space. Pt  with penetration and aspiration of thins and nectars during and after the  swallow to which pt was inconsistently aware. Silent  aspiration occurred  after the swallow from residuals- pt's vocal quality wet, however no  attempt to independently clear. Pt with significant residuals post swallow  in pharynx (in valleculae, lateral channels, and pyriforms) with reduced  ability to clear despite cueing him for repeat/dry swallows. Pt tolerated  honey-thick liquids best, but still had  residuals post swallow. Consider  downgrading diet to D1/puree (SLP to upgrade to mech soft clinically if  appropriate over course of meal) and honey-thick liquids. Cue pt to  "swallow hard" and repeat swallow 2-3x for each bite/sip. Clear  throat/hard cough periodically and sit upright after meals. Pt is at risk  for aspiration despite diet modifications due to dementia and significant  residuals in pharynx post swallow.           G-Codes - October 29, 2014 1443    Functional Assessment Tool Used MBSS; clinical judgement   Functional Limitations Swallowing   Swallow Current Status (J4782) At least 60 percent but less than 80  percent impaired, limited or restricted   Swallow Goal Status (N5621) At least 60 percent but less than 80  percent impaired, limited or restricted   Swallow Discharge Status [phone]) At least 60 percent but less than 80  percent impaired, limited or restricted          Recommendations/Treatment - 10/29/2014 1438    Swallow Evaluation Recommendations   SLP Diet Recommendations Dysphagia 1 (Puree);Honey   Liquid Administration via Cup   Medication Administration Whole meds with liquid   Supervision Patient able to self feed;Full supervision/cueing for  compensatory strategies   Compensations Multiple dry swallows after each bite/sip;Hard cough  after swallow;Effortful swallow   Postural Changes Remain semi-upright after after feeds/meals  (Comment);Seated upright at 90 degrees          Prognosis - 10-29-14 1438    Prognosis   Prognosis for Safe Diet Advancement Guarded   Barriers to Reach Goals  Severity of deficits   Individuals Consulted   Consulted and Agree with Results and Recommendations Patient;Patient  unable/family or caregiver not available   Report Sent to  Referring physician;Primary SLP      Problem List Patient Active Problem List   Diagnosis Date Noted  . SOB (shortness of breath)   . Elevated troponin   . Shortness of breath 07/20/2014  . Acute CHF 07/20/2014  . Coronary atherosclerosis of native coronary artery   . Hyperlipidemia   . PAF (paroxysmal atrial fibrillation)   . Hypertension   . Cerebrovascular disease 01/27/2005   Thank you,  Havery Moros, CCC-SLP [phone]  PORTER,DABNEY 29-Oct-2014, 2:44 PM       CLINICAL DATA:  Dementia, coronary artery disease, hypertension, former smoker, pleural effusions  EXAM: MODIFIED BARIUM SWALLOW  TECHNIQUE: Different consistencies of barium were administered orally to the patient by the Speech Pathologist. Imaging of the pharynx was performed in the lateral projection.  FLUOROSCOPY TIME:  Radiation Exposure Index (as provided by the fluoroscopic device): Not provided  If the device does not provide the exposure index:  Fluoroscopy Time:  4 minutes 12 seconds  Number of Acquired Images:  None  COMPARISON:  None  FINDINGS: Thin liquid- spillover of contrast to the piriform sinuses with cup presentation. Laryngeal penetration occurred from the residual contrast after swallowing. Aspiration of a small amount of contrast below the vocal cords was seen subsequently. No spontaneous cough reflex. Significant residuals within the vallecula, less in piriform sinuses.  Nectar thick liquid- spillover of contrast to the piriform sinuses with both cup and tsp presentation. Delayed initiation. Laryngeal penetration occurred primarily from residuals. A small amount of contrast passed below the vocal cords by gravity into the proximal trachea, with note of a weak spontaneous cough reflex.  Honey- with cup presentation,  laryngeal penetration occurred with eventual extension/aspiration by gravity of the contrast from the larynx  below the vocal cords into the proximal trachea. No cough reflex. Vallecular and less piriform sinus residuals were noted.  Pure- spillover of contrast to the vallecula. Weak swallowing. Significant vallecular and less significant piriform sinus residuals. No gross laryngeal penetration or aspiration.  Cracker-not evaluated  Pure with cracker- spillover of contrast to the piriform sinuses with delayed initiation. Significant vallecular and mild piriform sinus residuals. No laryngeal penetration or aspiration.  Barium tablet -  not evaluated  Known pleural effusions noted on lateral view.  IMPRESSION: Swallowing dysfunction as above.  Please refer to the Speech Pathologists report for complete details and recommendations.   Electronically Signed   By: Ulyses Southward M.D.   On: 10/28/2014 14:33   Dg Chest Portable 1 View  11/02/2014   CLINICAL DATA:  81 year old male with pneumonia and shortness of breath.  EXAM: PORTABLE CHEST - 1 VIEW  COMPARISON:  Chest x-ray 10/24/2014.  FINDINGS: Lung volumes have decreased slightly. Worsening bibasilar opacities may reflect areas of increasing atelectasis and/or consolidation, particularly in the right lung base. Small left and moderate right-sided pleural effusions. Mild cephalization of the pulmonary vasculature, without frank pulmonary edema. Heart size is borderline enlarged. Upper mediastinal contours are distorted by patient positioning. Atherosclerosis in the thoracic aorta. Status post median sternotomy for CABG. Right shoulder arthroplasty.  IMPRESSION: 1. Worsening aeration in the lung bases bilaterally, with increasing atelectasis and/or consolidation, particularly in the right lower lobe, as well as moderate right and small left pleural effusions. 2. Atherosclerosis.   Electronically Signed   By: Trudie Reed M.D.   On: 11/02/2014 11:09     Microbiology: Recent Results (from the past 240 hour(s))  Culture, blood (routine x 2)     Status: None (Preliminary result)   Collection Time: 11/02/14 10:58 AM  Result Value Ref Range Status   Specimen Description BLOOD LEFT ARM  Final   Special Requests BOTTLES DRAWN AEROBIC ONLY 4CC  Final   Culture NO GROWTH 2 DAYS  Final   Report Status PENDING  Incomplete  Culture, blood (routine x 2)     Status: None (Preliminary result)   Collection Time: 11/02/14 10:59 AM  Result Value Ref Range Status   Specimen Description BLOOD RIGHT HAND  Final   Special Requests BOTTLES DRAWN AEROBIC ONLY 6CC  Final   Culture NO GROWTH 2 DAYS  Final   Report Status PENDING  Incomplete  Urine culture     Status: None   Collection Time: 11/02/14 11:15 AM  Result Value Ref Range Status   Specimen Description URINE, CATHETERIZED  Final   Special Requests NONE  Final   Culture   Final    NO GROWTH 2 DAYS Performed at [facility]    Report Status 11/04/2014 FINAL  Final  MRSA PCR Screening     Status: None   Collection Time: 11/02/14  2:35 PM  Result Value Ref Range Status   MRSA by PCR NEGATIVE NEGATIVE Final    Comment:        The GeneXpert MRSA Assay (FDA approved for NASAL specimens only), is one component of a comprehensive MRSA colonization surveillance program. It is not intended to diagnose MRSA infection nor to guide or monitor treatment for MRSA infections.      Labs: Basic Metabolic Panel:  Recent Labs Lab 11/02/14 1015 11/03/14 0220 11/04/14 0510 11/05/14 0529  NA 147* 148* 146* 146*  K 3.9 3.7 4.0 3.7  CL 107 109 109 108  CO2 34* 30 31  33*  GLUCOSE 104* 99 86 100*  BUN 35* 32* 32* 28*  CREATININE 1.25* 1.23 1.40* 1.30*  CALCIUM 8.8* 8.4* 8.5* 8.5*   Liver Function Tests:  Recent Labs Lab 11/02/14 1031  AST 43*  ALT 23  ALKPHOS 71  BILITOT 0.4  PROT 6.8  ALBUMIN 2.7*    Recent Labs Lab 11/02/14 1031  LIPASE 33   No results for input(s):  AMMONIA in the last 168 hours. CBC:  Recent Labs Lab 11/02/14 1015 11/03/14 0220  WBC 5.6 7.4  NEUTROABS 4.3  --   HGB 12.2* 10.9*  HCT 40.7 36.8*  MCV 100.0 100.8*  PLT 199 195   Cardiac Enzymes:  Recent Labs Lab 11/02/14 1015 11/02/14 1449 11/02/14 2008 11/03/14 0220  TROPONINI 0.05* 0.05* 0.05* 0.05*   BNP: BNP (last 3 results)  Recent Labs  07/20/14 0523 11/02/14 1034  BNP 467.0* 591.0*    Signed:  Serafino Burciaga  [facility] 11/05/2014, 9:59 AM

## 2014-11-05 NOTE — Care Management Note (Signed)
Case Management Note  Patient Details  Name: [name] MRN: [id number] Date of Birth: 09-Mar-1924   Expected Discharge Date:                  Expected Discharge Plan:  Skilled Nursing Facility  In-House Referral:  Clinical Social Work  Discharge planning Services  CM Consult  Post Acute Care Choice:  NA Choice offered to:  NA  DME Arranged:    DME Agency:     HH Arranged:    HH Agency:     Status of Service:  Completed, signed off  Medicare Important Message Given:  Yes-second notification given Date Medicare IM Given:    Medicare IM give by:    Date Additional Medicare IM Given:    Additional Medicare Important Message give by:     If discussed at Long Length of Stay Meetings, dates discussed:    Additional Comments: Pt is from SNF and plan for patient to return to SNF at DC. CSW is aware, will see pt and will arrange for return to facility when ready. No CM needs.  Malcolm Metro, RN 11/05/2014, 7:49 AM

## 2014-11-05 NOTE — Clinical Social Work Note (Signed)
Clinical Social Work Assessment  Patient Details  Name: [name] MRN: [id number] Date of Birth: 05-05-1923  Date of referral:  11/05/14               Reason for consult:  Facility Placement                Permission sought to share information with:    Permission granted to share information::     Name::        Agency::     Relationship::     Contact Information:     Housing/Transportation Living arrangements for the past 2 months:  Skilled Building surveyor of Information:  Adult Children, Facility Patient Interpreter Needed:  None Criminal Activity/Legal Involvement Pertinent to Current Situation/Hospitalization:  No - Comment as needed Significant Relationships:  Adult Children Lives with:  Facility Resident Do you feel safe going back to the place where you live?  Yes Need for family participation in patient care:  Yes (Comment)  Care giving concerns:  Facility resident.   Social Worker assessment / plan:  CSW spoke with Debbie at [facility].  Debbie advised that patient has been a resident at the facility for a little over a month. She stated that patient is basically wheelchair bound, however he has ambulated with the physical therapist using a walker.  Debbie advised that staff assists patient with his ADLs.  She stated that patient has a very supportive family. Debbie advised that patient can return to the facility upon discharge.  CSW spoke with patient's daughter's Crystal and Marylene Land. Both daughters confirmed Debbie's statements.  They advised that they desire for patient to return to facility.  CSW advised both Eunice Blase and patient's daughters that patient would be discharged today and transported back to the facility.   CSW facilitated discharge. CSW signing off.   Employment status:  Retired Database administrator PT Recommendations:  Skilled Nursing Facility Information / Referral to community resources:     Patient/Family's Response to care:   Patient's family agreeable for patient to return to the [facility] at discharge.  Patient/Family's Understanding of and Emotional Response to Diagnosis, Current Treatment, and Prognosis:  Patient's family understands patient's diagnosis, treatment and prognosis. Based on this knowledge they agree that patient's care can best be provided at a SNF.   Emotional Assessment Appearance:  Developmentally appropriate Attitude/Demeanor/Rapport:  Unable to Assess Affect (typically observed):  Unable to Assess Orientation:    Alcohol / Substance use:  Not Applicable Psych involvement (Current and /or in the community):  No (Comment)  Discharge Needs  Concerns to be addressed:  Discharge Planning Concerns Readmission within the last 30 days:  No Current discharge risk:  None Barriers to Discharge:  No Barriers Identified   Annice Needy, LCSW 11/05/2014, 10:41 AM [phone]

## 2014-11-05 NOTE — Progress Notes (Signed)
Patient's heart rate is ranging from 110-140s. Has also gotten as high as 150. Spoke with hospitalist who was on the floor seeing other patients. Received a verbal order to give a 1 time dose of atenolol . Will continue to monitor closely.

## 2014-11-05 NOTE — Care Management Important Message (Signed)
Important Message  Patient Details  Name: [name] MRN: [id number] Date of Birth: [birth year]   Medicare Important Message Given:  Yes-second notification given    Malcolm Metro, RN 11/05/2014, 7:48 AM

## 2014-11-05 NOTE — Progress Notes (Signed)
Discharged to [facility], out in stable condition via stretcher by [facility].

## 2014-11-07 LAB — CULTURE, BLOOD (ROUTINE X 2)
Culture: NO GROWTH
Culture: NO GROWTH

## 2014-11-28 DEATH — deceased

## 2015-06-24 ENCOUNTER — Encounter (HOSPITAL_COMMUNITY): Payer: Self-pay

## 2015-09-26 IMAGING — CT CT CHEST W/O CM
2 of 3 series · 15 of 36 positions shown, 18 images · non-contrast
Comparison: 05/30/2010

CLINICAL DATA: Abnormal right lower lobe. Question infection.
Evaluate for masses there was right lower lobe pathology on prior
studies

EXAM:
CT CHEST WITHOUT CONTRAST
TECHNIQUE: Multidetector CT imaging of the chest was performed following the
standard protocol without IV contrast.

[Series 2: chestroutine 5.0 b40f · axial · 0.66mm/px · z∈[-391,-101]mm · 12 of 70 slices shown, 15 images]
[im 6/70  mediastinal]
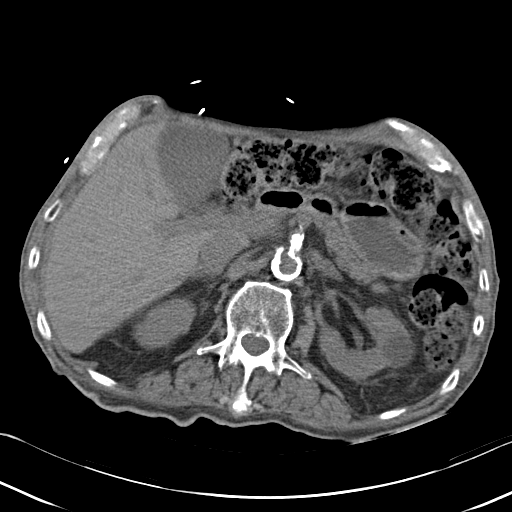
[im 6/70  lung]
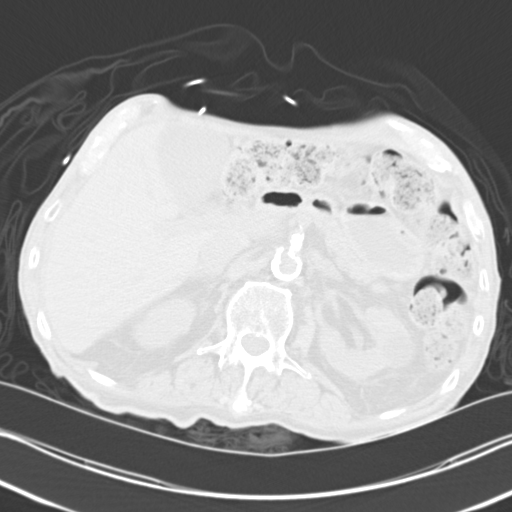
[im 11/70  lung]
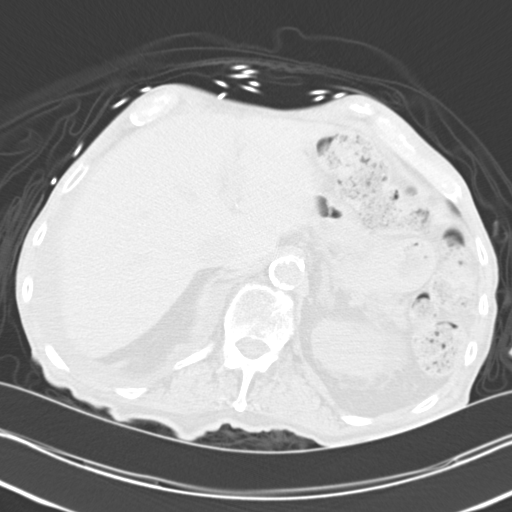
[im 16/70  lung]
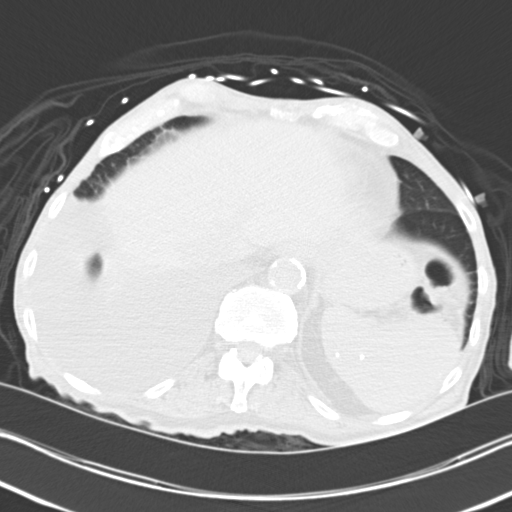
[im 21/70  lung]
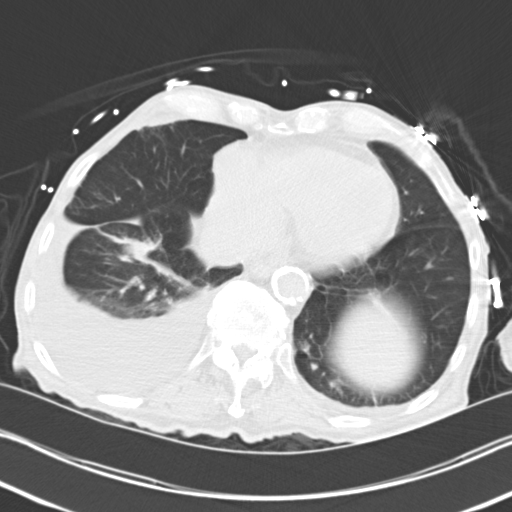
[im 26/70  mediastinal]
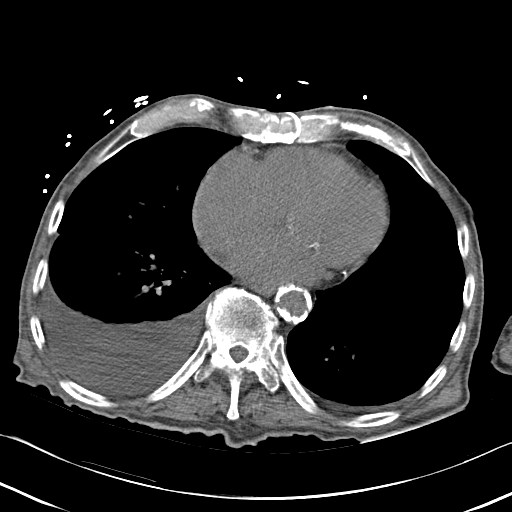
[im 26/70  lung]
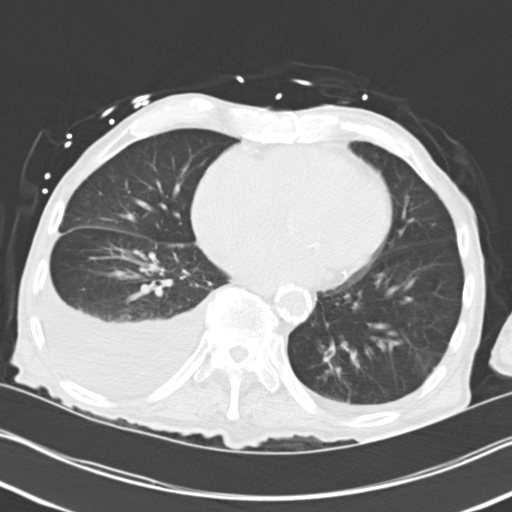
[im 31/70  lung]
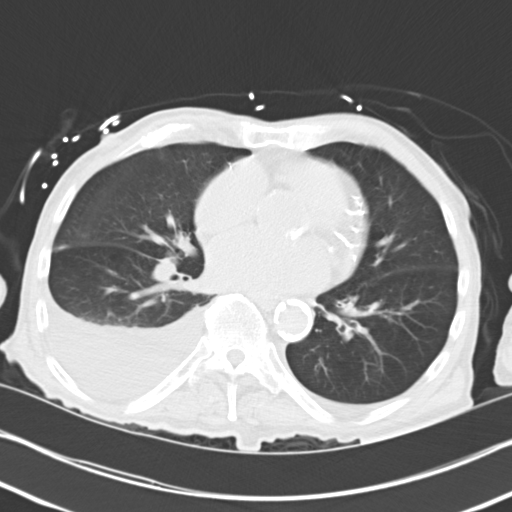
[im 39/70  lung]
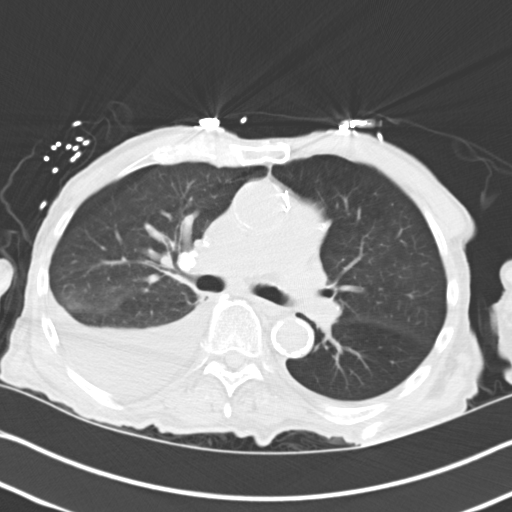
[im 44/70  lung]
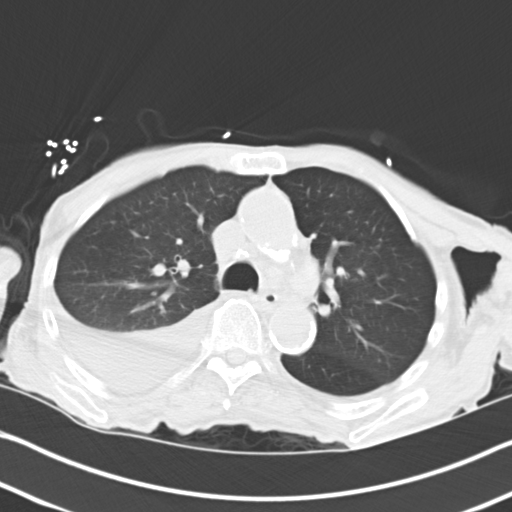
[im 49/70  mediastinal]
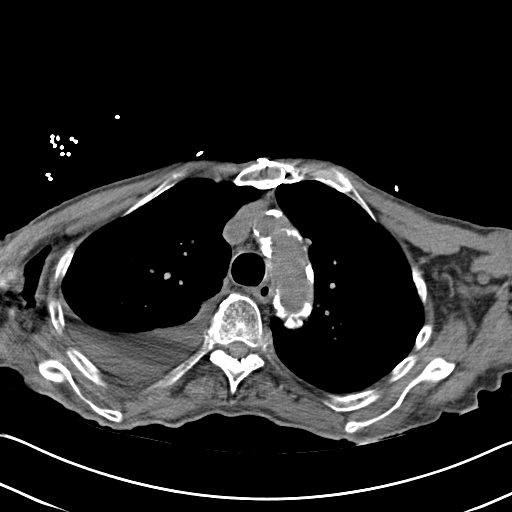
[im 49/70  lung]
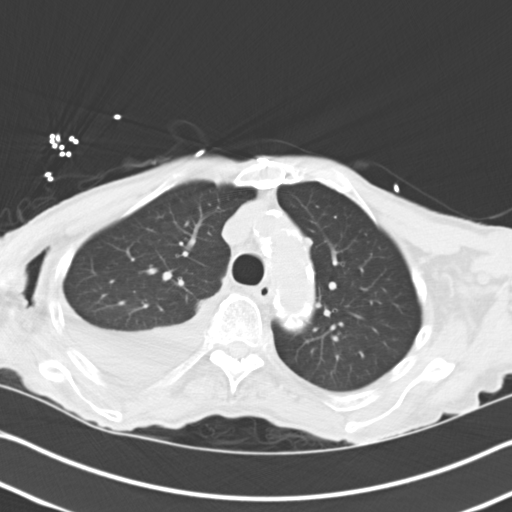
[im 54/70  lung]
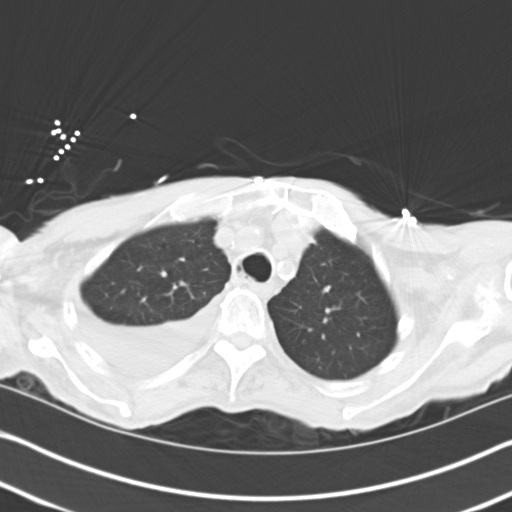
[im 59/70  lung]
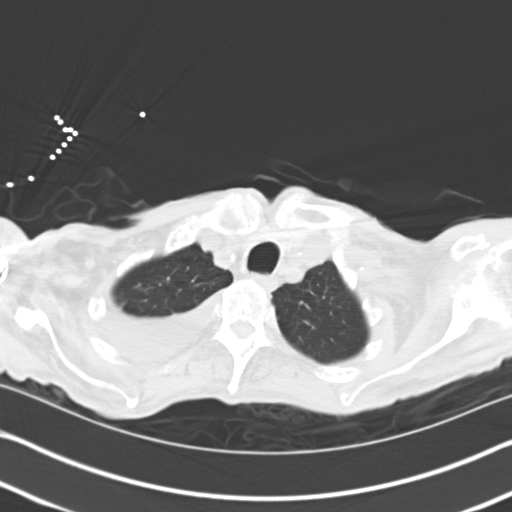
[im 64/70  lung]
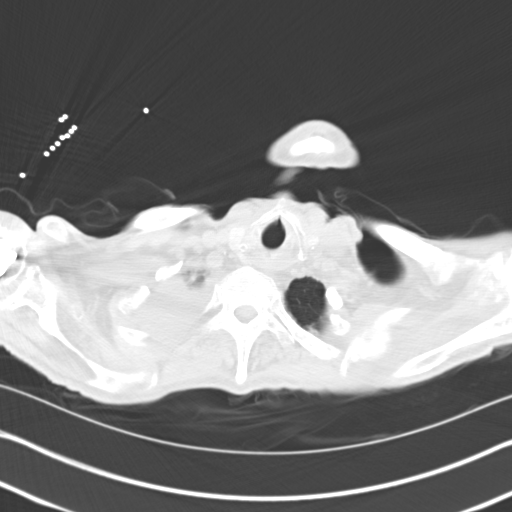

[Series 4: mpr coro 3mm · coronal · 0.68mm/px · 3 of 76 slices shown]
[im 16/76  lung]
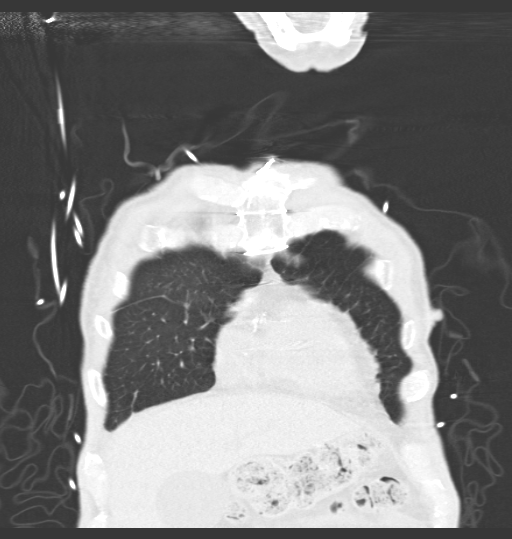
[im 31/76  lung]
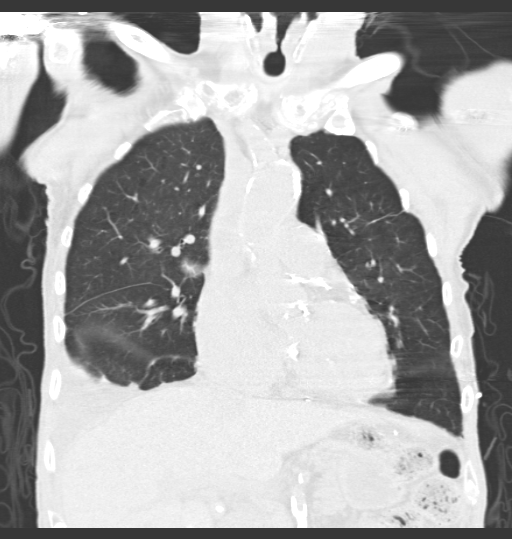
[im 46/76  lung]
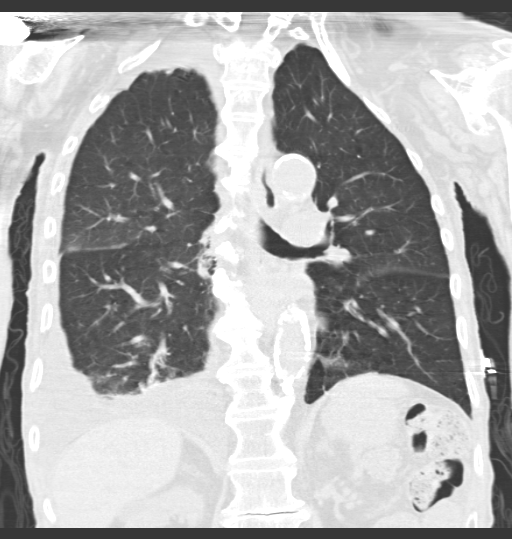

[15 of 36 positions shown; findings below may reference images not displayed]

FINDINGS: THORACIC INLET/BODY WALL:

No acute abnormality.

MEDIASTINUM:

No cardiomegaly or pericardial effusion. Extensive coronary
atherosclerosis, status post CABG. No evidence of acute vascular
abnormality. Calcified mediastinal and right hilar lymph nodes
compatible with remote granulomatous disease.

LUNG WINDOWS:

There is a moderate layering right pleural effusion, extending from
the base to the apex. Bandlike opacity with volume loss in the right
lower lobe consistent with atelectasis. No definite superimposed
pneumonia. No visible mass lesion or central airway obstruction.
There is clustering of high-density debris/calcification in the
atelectatic right lower lobe, nonspecific. Diffuse bronchial wall
thickening, likely chronic bronchitis given the appearance
previously and history of smoking. No suspicious pulmonary nodules.
Granulomatous changes noted at the right apex.

UPPER ABDOMEN:

Cholelithiasis without inflammatory changes. Bilateral upper pole
renal cysts. There is extensive granulomatous changes in the spleen.

OSSEOUS:

No acute fracture.  No suspicious lytic or blastic lesions.
IMPRESSION: 1. Moderate layering right pleural effusion with right lower lobe
atelectasis. No definite underlying mass or pneumonia.
2. Chronic bronchitis.
3. Cholelithiasis.
